# Patient Record
Sex: Male | Born: 1995
Health system: Southern US, Community
[De-identification: ages and names within clinical notes are randomized; demographics above are authoritative.]

## PROBLEM LIST (undated history)

## (undated) DIAGNOSIS — Z803 Family history of malignant neoplasm of breast: Secondary | ICD-10-CM

## (undated) DIAGNOSIS — Z808 Family history of malignant neoplasm of other organs or systems: Secondary | ICD-10-CM

## (undated) DIAGNOSIS — J45909 Unspecified asthma, uncomplicated: Secondary | ICD-10-CM

## (undated) DIAGNOSIS — Z8049 Family history of malignant neoplasm of other genital organs: Secondary | ICD-10-CM

## (undated) DIAGNOSIS — J189 Pneumonia, unspecified organism: Secondary | ICD-10-CM

## (undated) DIAGNOSIS — K219 Gastro-esophageal reflux disease without esophagitis: Secondary | ICD-10-CM

## (undated) DIAGNOSIS — Z800 Family history of malignant neoplasm of digestive organs: Secondary | ICD-10-CM

## (undated) HISTORY — DX: Unspecified asthma, uncomplicated: J45.909

## (undated) HISTORY — PX: UPPER GI ENDOSCOPY: SHX6162

## (undated) HISTORY — PX: NO PAST SURGERIES: SHX2092

## (undated) HISTORY — DX: Family history of malignant neoplasm of other organs or systems: Z80.8

## (undated) HISTORY — DX: Family history of malignant neoplasm of breast: Z80.3

## (undated) HISTORY — DX: Family history of malignant neoplasm of other genital organs: Z80.49

## (undated) HISTORY — DX: Family history of malignant neoplasm of digestive organs: Z80.0

---
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
## 2016-05-02 ENCOUNTER — Telehealth: Payer: Self-pay | Admitting: Family Medicine

## 2016-05-02 NOTE — Telephone Encounter (Signed)
Work him in if we can--

## 2016-05-02 NOTE — Telephone Encounter (Signed)
He can have a 6 pm apt for 30 min's and advise him to bring in his immunization records.    KP

## 2016-05-02 NOTE — Telephone Encounter (Signed)
Caller name: Jason Martinique Relationship to patient: father Can be reached: [phone]   Reason for call: Pt father stated Dr. Etter Sjogren gave verbal approval for pt to be setup with her. Pt is going back to school starting Aug 11 and wanting to get him in for cpe/new pt prior to that. He said Dr. Etter Sjogren indicated he might be able to be worked in. I scheduled him for 07/11/16 and have on wait list but is there a spot to put him in sooner?

## 2016-05-02 NOTE — Telephone Encounter (Signed)
Please advise      KP 

## 2016-05-07 NOTE — Telephone Encounter (Signed)
Immunization records received via fax from [place] and abstracted into pt's chart. Forwarded to Dr. Carollee Herter. JG//CMA

## 2016-05-19 ENCOUNTER — Telehealth: Payer: Self-pay | Admitting: *Deleted

## 2016-05-19 NOTE — Telephone Encounter (Signed)
Patient returning your call.

## 2016-05-19 NOTE — Telephone Encounter (Signed)
Unable to reach patient at time of Pre-Visit Call.  Left message for patient to return call when available.    

## 2016-05-20 ENCOUNTER — Encounter: Payer: Self-pay | Admitting: Family Medicine

## 2016-05-20 ENCOUNTER — Ambulatory Visit (INDEPENDENT_AMBULATORY_CARE_PROVIDER_SITE_OTHER): Payer: BLUE CROSS/BLUE SHIELD | Admitting: Family Medicine

## 2016-05-20 VITALS — BP 122/74 | HR 95 | Temp 98.5°F | Ht 68.0 in | Wt 166.4 lb

## 2016-05-20 DIAGNOSIS — Z0000 Encounter for general adult medical examination without abnormal findings: Secondary | ICD-10-CM

## 2016-05-20 DIAGNOSIS — D229 Melanocytic nevi, unspecified: Secondary | ICD-10-CM | POA: Diagnosis not present

## 2016-05-20 DIAGNOSIS — Z23 Encounter for immunization: Secondary | ICD-10-CM | POA: Diagnosis not present

## 2016-05-20 HISTORY — DX: Encounter for general adult medical examination without abnormal findings: Z00.00

## 2016-05-20 NOTE — Addendum Note (Signed)
Addended by: Ewing Schlein on: 05/20/2016 06:57 PM   Modules accepted: Orders

## 2016-05-20 NOTE — Patient Instructions (Signed)

## 2016-05-20 NOTE — Assessment & Plan Note (Signed)
See AVS Check labs Men b given today--- #2 in 1 month ghm utd rto 1 year for cpe

## 2016-05-20 NOTE — Progress Notes (Signed)
Patient ID: Dennis Kim, male    DOB: 07/13/1996  Age: 20 y.o. MRN: [id number]    Subjective:  Subjective  HPI Dennis Kim presents to establish.  No complaints.    Review of Systems  Constitutional: Negative for fever.  HENT: Negative for congestion.   Respiratory: Negative for shortness of breath.   Cardiovascular: Negative for chest pain, palpitations and leg swelling.  Gastrointestinal: Negative for abdominal pain, blood in stool and nausea.  Genitourinary: Negative for dysuria and frequency.  Skin: Negative for rash.  Allergic/Immunologic: Negative for environmental allergies.  Neurological: Negative for dizziness and headaches.  Psychiatric/Behavioral: The patient is not nervous/anxious.     History Past Medical History:  Diagnosis Date  . Asthma     He has no past surgical history on file.   His family history includes Brain cancer in his maternal grandmother; Thyroid disease in his father.He reports that he has never smoked. He has never used smokeless tobacco. He reports that he drinks alcohol. He reports that he does not use drugs.  No current outpatient prescriptions on file prior to visit.   No current facility-administered medications on file prior to visit.      Objective:  Objective  Physical Exam  Constitutional: He is oriented to person, place, and time. He appears well-developed and well-nourished. No distress.  HENT:  Head: Normocephalic and atraumatic.  Right Ear: External ear normal.  Left Ear: External ear normal.  Nose: Nose normal.  Mouth/Throat: Oropharynx is clear and moist. No oropharyngeal exudate.  Eyes: Conjunctivae and EOM are normal. Pupils are equal, round, and reactive to light. Right eye exhibits no discharge. Left eye exhibits no discharge.  Neck: Normal range of motion. Neck supple. No JVD present. No thyromegaly present.  Cardiovascular: Normal rate, regular rhythm and intact distal pulses.  Exam reveals no gallop and no  friction rub.   No murmur heard. Pulmonary/Chest: Effort normal and breath sounds normal. No respiratory distress. He has no wheezes. He has no rales. He exhibits no tenderness.  Abdominal: Soft. Bowel sounds are normal. He exhibits no distension and no mass. There is no tenderness. There is no rebound and no guarding.  Musculoskeletal: Normal range of motion. He exhibits no edema or tenderness.  Lymphadenopathy:    He has no cervical adenopathy.  Neurological: He is alert and oriented to person, place, and time. He displays normal reflexes. He exhibits normal muscle tone.  Skin: Skin is warm and dry. No rash noted. He is not diaphoretic. No erythema. No pallor.     Psychiatric: He has a normal mood and affect. His behavior is normal. Judgment and thought content normal.  Nursing note and vitals reviewed.  BP 122/74 (BP Location: Left Arm, Patient Position: Sitting, Cuff Size: Normal)   Pulse 95   Temp 98.5 F (36.9 C) (Oral)   Ht 5\' 8"  (1.727 m)   Wt 166 lb 6.4 oz (75.5 kg)   SpO2 98%   BMI 25.30 kg/m  Wt Readings from Last 3 Encounters:  05/20/16 166 lb 6.4 oz (75.5 kg) (69 %, Z= 0.48)*   * Growth percentiles are based on CDC 2-20 Years data.     No results found for: WBC, HGB, HCT, PLT, GLUCOSE, CHOL, TRIG, HDL, LDLDIRECT, LDLCALC, ALT, AST, NA, K, CL, CREATININE, BUN, CO2, TSH, PSA, INR, GLUF, HGBA1C, MICROALBUR  Dg Chest 2 View  Result Date: 07/06/2008 Clinical Data: Cough.  Follow up pneumonia.  CHEST - 2 VIEW  Comparison: [facility] chest  x-ray 05/28/2008.  Findings: Interval clearing previous left lower lobe pneumonia and small left pleural effusion noted.  Slight stable asthma or bronchiolitis findings noted with lungs otherwise clear.  Upper airway, mediastinum, heart, pleura, and osseous structures appear normal.  IMPRESSION:  1.  Interval clearing previous left lower lobe pneumonia and small left pleural effusion. 2.  Otherwise stable changes of asthma or slight perihilar  bronchiolitis. 3.  Otherwise, negative. Provider: Pearla Dubonnet    Assessment & Plan:  Plan  [name] does not currently have medications on file.  No orders of the defined types were placed in this encounter.   Problem List Items Addressed This Visit      Unprioritized   Preventative health care - Primary    See AVS Check labs Men b given today--- #2 in 1 month ghm utd rto 1 year for cpe      Relevant Orders   Comprehensive metabolic panel   CBC with Differential/Platelet   Lipid panel   POCT urinalysis dipstick   TSH    Other Visit Diagnoses    Suspicious nevus       Relevant Orders   Ambulatory referral to Dermatology      Follow-up: No Follow-up on file.  Ann Held, DO

## 2016-05-21 ENCOUNTER — Other Ambulatory Visit (INDEPENDENT_AMBULATORY_CARE_PROVIDER_SITE_OTHER): Payer: BLUE CROSS/BLUE SHIELD

## 2016-05-21 ENCOUNTER — Encounter: Payer: Self-pay | Admitting: Family Medicine

## 2016-05-21 DIAGNOSIS — Z0000 Encounter for general adult medical examination without abnormal findings: Secondary | ICD-10-CM | POA: Diagnosis not present

## 2016-05-21 LAB — POCT URINALYSIS DIPSTICK
Bilirubin, UA: NEGATIVE
Blood, UA: NEGATIVE
Glucose, UA: NEGATIVE
Ketones, UA: NEGATIVE
Leukocytes, UA: NEGATIVE
Nitrite, UA: NEGATIVE
Protein, UA: NEGATIVE
Spec Grav, UA: >=1.03
Urobilinogen, UA: 0.2
pH, UA: 6

## 2016-05-21 LAB — COMPREHENSIVE METABOLIC PANEL
ALK PHOS: 96 U/L (ref 52–171)
ALT: 20 U/L (ref 0–53)
AST: 17 U/L (ref 0–37)
Albumin: 4.8 g/dL (ref 3.5–5.2)
BILIRUBIN TOTAL: 0.6 mg/dL (ref 0.2–1.2)
BUN: 9 mg/dL (ref 6–23)
CO2: 29 meq/L (ref 19–32)
CREATININE: 0.8 mg/dL (ref 0.40–1.50)
Calcium: 10 mg/dL (ref 8.4–10.5)
Chloride: 102 mEq/L (ref 96–112)
GFR: 131.92 mL/min (ref >60.00)
GLUCOSE: 98 mg/dL (ref 70–99)
Potassium: 3.8 mEq/L (ref 3.5–5.1)
SODIUM: 140 meq/L (ref 135–145)
TOTAL PROTEIN: 7.8 g/dL (ref 6.0–8.3)

## 2016-05-21 LAB — LIPID PANEL
Cholesterol: 181 mg/dL (ref 0–200)
HDL: 45.1 mg/dL
LDL Cholesterol: 107 mg/dL — ABNORMAL HIGH (ref 0–99)
NonHDL: 136.18
Total CHOL/HDL Ratio: 4
Triglycerides: 144 mg/dL (ref 0.0–149.0)
VLDL: 28.8 mg/dL (ref 0.0–40.0)

## 2016-05-21 LAB — CBC WITH DIFFERENTIAL/PLATELET
Basophils Absolute: 0 K/uL (ref 0.0–0.1)
Basophils Relative: 0.3 % (ref 0.0–3.0)
Eosinophils Absolute: 0.1 K/uL (ref 0.0–0.7)
Eosinophils Relative: 0.7 % (ref 0.0–5.0)
HCT: 45.2 % (ref 36.0–49.0)
Hemoglobin: 15.6 g/dL (ref 12.0–16.0)
Lymphocytes Relative: 17.6 % — ABNORMAL LOW (ref 24.0–48.0)
Lymphs Abs: 2.3 K/uL (ref 0.7–4.0)
MCHC: 34.5 g/dL (ref 31.0–37.0)
MCV: 88.3 fl (ref 78.0–98.0)
Monocytes Absolute: 1.4 K/uL — ABNORMAL HIGH (ref 0.1–1.0)
Monocytes Relative: 11.2 % (ref 3.0–12.0)
Neutro Abs: 9.1 K/uL — ABNORMAL HIGH (ref 1.4–7.7)
Neutrophils Relative %: 70.2 % (ref 43.0–71.0)
Platelets: 214 K/uL (ref 150.0–575.0)
RBC: 5.12 Mil/uL (ref 3.80–5.70)
RDW: 13.9 % (ref 11.4–15.5)
WBC: 13 K/uL (ref 4.5–13.5)

## 2016-05-21 LAB — TSH: TSH: 2.06 u[IU]/mL (ref 0.40–5.00)

## 2016-06-20 DIAGNOSIS — D485 Neoplasm of uncertain behavior of skin: Secondary | ICD-10-CM | POA: Diagnosis not present

## 2016-06-20 DIAGNOSIS — D2272 Melanocytic nevi of left lower limb, including hip: Secondary | ICD-10-CM | POA: Diagnosis not present

## 2016-06-20 DIAGNOSIS — D2262 Melanocytic nevi of left upper limb, including shoulder: Secondary | ICD-10-CM | POA: Diagnosis not present

## 2016-07-11 ENCOUNTER — Ambulatory Visit: Payer: Self-pay | Admitting: Family Medicine

## 2016-08-04 ENCOUNTER — Encounter: Payer: Self-pay | Admitting: Family Medicine

## 2016-08-04 DIAGNOSIS — D2272 Melanocytic nevi of left lower limb, including hip: Secondary | ICD-10-CM | POA: Diagnosis not present

## 2016-08-04 DIAGNOSIS — D485 Neoplasm of uncertain behavior of skin: Secondary | ICD-10-CM | POA: Diagnosis not present

## 2017-07-31 ENCOUNTER — Ambulatory Visit (INDEPENDENT_AMBULATORY_CARE_PROVIDER_SITE_OTHER): Payer: BLUE CROSS/BLUE SHIELD | Admitting: Family Medicine

## 2017-07-31 ENCOUNTER — Encounter: Payer: Self-pay | Admitting: Family Medicine

## 2017-07-31 VITALS — BP 140/80 | HR 84 | Temp 98.2°F | Resp 16 | Ht 67.0 in | Wt 154.6 lb

## 2017-07-31 DIAGNOSIS — J069 Acute upper respiratory infection, unspecified: Secondary | ICD-10-CM

## 2017-07-31 DIAGNOSIS — R05 Cough: Secondary | ICD-10-CM

## 2017-07-31 DIAGNOSIS — J029 Acute pharyngitis, unspecified: Secondary | ICD-10-CM

## 2017-07-31 DIAGNOSIS — J40 Bronchitis, not specified as acute or chronic: Secondary | ICD-10-CM | POA: Diagnosis not present

## 2017-07-31 DIAGNOSIS — R059 Cough, unspecified: Secondary | ICD-10-CM

## 2017-07-31 LAB — POCT RAPID STREP A (OFFICE): RAPID STREP A SCREEN: NEGATIVE

## 2017-07-31 MED ORDER — LEVOCETIRIZINE DIHYDROCHLORIDE 5 MG PO TABS: 5.0000 mg | ORAL_TABLET | Freq: Every evening | ORAL | 5 refills | Status: DC

## 2017-07-31 MED ORDER — FLUTICASONE PROPIONATE 50 MCG/ACT NA SUSP: 2.0000 | Freq: Every day | NASAL | 6 refills | Status: DC

## 2017-07-31 MED ORDER — AZITHROMYCIN 250 MG PO TABS: ORAL_TABLET | ORAL | 0 refills | Status: DC

## 2017-07-31 NOTE — Patient Instructions (Signed)

## 2017-07-31 NOTE — Progress Notes (Signed)
Patient ID: Dennis Kim, male    DOB: 1996/03/09  Age: 21 y.o. MRN: [id number]    Subjective:  Subjective  HPI Dennis Kim presents for cough, sore throat, congestion, and chest tightness x 3 weeks   + fever-- 100  Pt has been taking ibuprofen.  + stuffy nose.     Review of Systems  Constitutional: Positive for chills and fever.  HENT: Positive for congestion, postnasal drip and rhinorrhea. Negative for sinus pressure.   Respiratory: Positive for cough and chest tightness. Negative for shortness of breath and wheezing.   Cardiovascular: Negative for chest pain, palpitations and leg swelling.  Allergic/Immunologic: Negative for environmental allergies.    History Past Medical History:  Diagnosis Date  . Asthma     He has no past surgical history on file.   His family history includes Brain cancer in his maternal grandmother; Thyroid disease in his father.He reports that he has never smoked. He has never used smokeless tobacco. He reports that he drinks alcohol. He reports that he does not use drugs.  No current outpatient prescriptions on file prior to visit.   No current facility-administered medications on file prior to visit.      Objective:  Objective  Physical Exam  Constitutional: He is oriented to person, place, and time. He appears well-developed and well-nourished.  HENT:  Right Ear: External ear normal.  Left Ear: External ear normal.  Nose: Right sinus exhibits no maxillary sinus tenderness and no frontal sinus tenderness. Left sinus exhibits no maxillary sinus tenderness and no frontal sinus tenderness.  Mouth/Throat: Posterior oropharyngeal erythema present.  + PND + errythema  Eyes: Conjunctivae are normal. Right eye exhibits no discharge. Left eye exhibits no discharge.  Cardiovascular: Normal rate, regular rhythm and normal heart sounds.   No murmur heard. Pulmonary/Chest: Effort normal and breath sounds normal. No respiratory distress. He has no  wheezes. He has no rales. He exhibits no tenderness.  Musculoskeletal: He exhibits no edema.  Lymphadenopathy:    He has no cervical adenopathy.  Neurological: He is alert and oriented to person, place, and time.  Nursing note and vitals reviewed.  BP 140/80   Pulse 84   Temp 98.2 F (36.8 C) (Oral)   Resp 16   Ht 5\' 7"  (1.702 m)   Wt 154 lb 9.6 oz (70.1 kg)   SpO2 99%   BMI 24.21 kg/m  Wt Readings from Last 3 Encounters:  07/31/17 154 lb 9.6 oz (70.1 kg)  05/20/16 166 lb 6.4 oz (75.5 kg) (69 %, Z= 0.48)*   * Growth percentiles are based on CDC 2-20 Years data.     Lab Results  Component Value Date   WBC 13.0 05/21/2016   HGB 15.6 05/21/2016   HCT 45.2 05/21/2016   PLT 214.0 05/21/2016   GLUCOSE 98 05/21/2016   CHOL 181 05/21/2016   TRIG 144.0 05/21/2016   HDL 45.10 05/21/2016   LDLCALC 107 (H) 05/21/2016   ALT 20 05/21/2016   AST 17 05/21/2016   NA 140 05/21/2016   K 3.8 05/21/2016   CL 102 05/21/2016   CREATININE 0.80 05/21/2016   BUN 9 05/21/2016   CO2 29 05/21/2016   TSH 2.06 05/21/2016    Dg Chest 2 View  Result Date: 07/06/2008 Clinical Data: Cough.  Follow up pneumonia.  CHEST - 2 VIEW  Comparison: [facility] chest x-ray 05/28/2008.  Findings: Interval clearing previous left lower lobe pneumonia and small left pleural effusion noted.  Slight stable asthma  or bronchiolitis findings noted with lungs otherwise clear.  Upper airway, mediastinum, heart, pleura, and osseous structures appear normal.  IMPRESSION:  1.  Interval clearing previous left lower lobe pneumonia and small left pleural effusion. 2.  Otherwise stable changes of asthma or slight perihilar bronchiolitis. 3.  Otherwise, negative. Provider: Pearla Dubonnet    Assessment & Plan:  Plan  I am having [name] start on fluticasone, levocetirizine, and azithromycin.  Meds ordered this encounter  Medications  . fluticasone (FLONASE) 50 MCG/ACT nasal spray    Sig: Place 2 sprays into both nostrils daily.     Dispense:  16 g    Refill:  6  . levocetirizine (XYZAL) 5 MG tablet    Sig: Take 1 tablet (5 mg total) by mouth every evening.    Dispense:  30 tablet    Refill:  5  . azithromycin (ZITHROMAX Z-PAK) 250 MG tablet    Sig: As directed    Dispense:  6 each    Refill:  0    Problem List Items Addressed This Visit    None    Visit Diagnoses    Sore throat    -  Primary   Relevant Orders   POCT rapid strep A (Completed)   Cough       Relevant Orders   POCT Influenza A/B   Viral upper respiratory tract infection       Relevant Medications   fluticasone (FLONASE) 50 MCG/ACT nasal spray   levocetirizine (XYZAL) 5 MG tablet   azithromycin (ZITHROMAX Z-PAK) 250 MG tablet   Bronchitis       Relevant Medications   azithromycin (ZITHROMAX Z-PAK) 250 MG tablet      Follow-up: Return if symptoms worsen or fail to improve.  Ann Held, DO

## 2017-08-24 LAB — POCT INFLUENZA A/B
INFLUENZA A, POC: NEGATIVE
INFLUENZA B, POC: NEGATIVE

## 2018-05-31 DIAGNOSIS — R0982 Postnasal drip: Secondary | ICD-10-CM | POA: Diagnosis not present

## 2018-05-31 DIAGNOSIS — J029 Acute pharyngitis, unspecified: Secondary | ICD-10-CM | POA: Diagnosis not present

## 2018-06-30 ENCOUNTER — Encounter: Payer: Self-pay | Admitting: Medical

## 2018-06-30 ENCOUNTER — Ambulatory Visit (HOSPITAL_BASED_OUTPATIENT_CLINIC_OR_DEPARTMENT_OTHER)
Admission: RE | Admit: 2018-06-30 | Discharge: 2018-06-30 | Disposition: A | Discharge status: Home / Self Care | Payer: BLUE CROSS/BLUE SHIELD | Source: Ambulatory Visit | Attending: Medical | Admitting: Medical

## 2018-06-30 ENCOUNTER — Telehealth: Payer: Self-pay | Admitting: Medical

## 2018-06-30 ENCOUNTER — Encounter: Payer: Self-pay | Admitting: *Deleted

## 2018-06-30 ENCOUNTER — Ambulatory Visit: Payer: BLUE CROSS/BLUE SHIELD | Admitting: Medical

## 2018-06-30 VITALS — BP 124/72 | HR 79 | Temp 98.0°F | Resp 16 | Ht 67.0 in | Wt 146.2 lb

## 2018-06-30 DIAGNOSIS — R9431 Abnormal electrocardiogram [ECG] [EKG]: Secondary | ICD-10-CM | POA: Diagnosis not present

## 2018-06-30 DIAGNOSIS — I319 Disease of pericardium, unspecified: Secondary | ICD-10-CM

## 2018-06-30 DIAGNOSIS — R0789 Other chest pain: Secondary | ICD-10-CM

## 2018-06-30 DIAGNOSIS — R079 Chest pain, unspecified: Secondary | ICD-10-CM | POA: Diagnosis not present

## 2018-06-30 DIAGNOSIS — Z8709 Personal history of other diseases of the respiratory system: Secondary | ICD-10-CM | POA: Diagnosis not present

## 2018-06-30 DIAGNOSIS — R0602 Shortness of breath: Secondary | ICD-10-CM | POA: Diagnosis not present

## 2018-06-30 LAB — SEDIMENTATION RATE: SED RATE: 5 mm/h (ref 0–15)

## 2018-06-30 LAB — POCT RAPID STREP A (OFFICE): Rapid Strep A Screen: NEGATIVE

## 2018-06-30 LAB — TROPONIN I: TNIDX: 0 ug/L (ref 0.00–0.06)

## 2018-06-30 MED ORDER — ALBUTEROL SULFATE HFA 108 (90 BASE) MCG/ACT IN AERS: 2.0000 | INHALATION_SPRAY | Freq: Four times a day (QID) | RESPIRATORY_TRACT | 2 refills | Status: DC | PRN

## 2018-06-30 NOTE — Telephone Encounter (Signed)
Copied from [place] [id number]. Topic: Quick Communication - Appointment Cancellation >> Jun 30, 2018  9:36 AM Rutherford Nail, NT wrote: Patient called to cancel appointment scheduled for 07/02/18. Patient has rescheduled their appointment.  Route to department's PEC pool.

## 2018-06-30 NOTE — Patient Instructions (Addendum)
For your history of atypical chest pain following remote possible strep infection( with concern possible pericarditis), I want you to take ibuprofen otc  600 to 800 mg 3 times daily.  You had some slight EKG abnormalities reviewed by cardiologist(Dr Revinkar) and this could be associated with a possible pericarditis(I thought borderline st elevation in anterior leads on review and discussion with cardiologist).  The cardiologist recommended possible echocardiogram and a sed rate.  In addition I want to go ahead and get your troponin/cardiac protein study and chest x-ray.  If you have any wheezing then would recommend using albuterol.  But your symptoms do not appear asthma-like.  Your rapid strep test was negative today but you also had been on the a azithromycin antibiotic.  If you have any worsening signs symptoms after hours despite the above measures then recommend ED evaluation.  Please make sure that you answer your phone as I do want to get the echo and that would need to be scheduled.  Follow-up in 7 days or as needed.

## 2018-06-30 NOTE — Telephone Encounter (Signed)
Will you see order for echo. Do you need to get prior authorization? See order I thinks [facility]. Will you call them and have them get him scheduled.

## 2018-06-30 NOTE — Progress Notes (Signed)
Subjective:    Patient ID: Dennis Kim, male    DOB: 1995-12-25, 22 y.o.   MRN: [id number]  HPI  Pt in reporting that on Sunday when he was sitting in his apartment he felt like he could not take full deep breath. When he takes full deep breath he points to area above xyphoid process. Pt states he did work out on Saturday and he state walked with 3-4 sprints on treadmill for 30 seconds-1 mile. He thinks treadmill during jobs/sprints set 6-7 mph. No chest discomfort or sob at that time.   Pt states remotely 3-4 weeks ago had st. They did give him antibiotic but no strep test was negative. Pain in his chest is constant since Sunday.   Pt has history of asthma. He states in middle school when 22 yo was his last  Asthma attack. When he walks he feels sob when walks past 3-4 days.  Pt states tried sister neb machine with albuterol yesterday evening. He states did not help much. Only states made his heart beat fast.  Pt state physical faint tight sensation in lower chest seem to worsen little bit.  No leg pain. No leg swelling.  Pt states urgent care gave him azithromycin 3-4 weeks ago for moderate/nagging  Throat pain for days or so. He can't remember details of that pain. But now no longer complaining of st.  At rest he has 3-4 low level discomfort. With activity area of discomfort 7-8/10.   No report of any drug use.     Review of Systems  Constitutional: Negative for chills, fatigue and fever.  HENT: Positive for sore throat. Negative for congestion, mouth sores, postnasal drip and sneezing.        St 3-4 weeks ago.  Respiratory: Negative for choking, chest tightness, shortness of breath and wheezing.   Cardiovascular: Positive for chest pain. Negative for palpitations.       Atypical chest discomfort.  Gastrointestinal: Negative for abdominal pain.  Hematological: Negative for adenopathy. Does not bruise/bleed easily.  Psychiatric/Behavioral: Negative for behavioral problems,  hallucinations and suicidal ideas. The patient is not nervous/anxious.    Past Medical History:  Diagnosis Date  . Asthma      Social History   Socioeconomic History  . Marital status: Single    Spouse name: Not on file  . Number of children: Not on file  . Years of education: Not on file  . Highest education level: Not on file  Occupational History    Comment: student [employer]  Social Needs  . Financial resource strain: Not on file  . Food insecurity:    Worry: Not on file    Inability: Not on file  . Transportation needs:    Medical: Not on file    Non-medical: Not on file  Tobacco Use  . Smoking status: Never Smoker  . Smokeless tobacco: Never Used  Substance and Sexual Activity  . Alcohol use: Yes    Comment: rare  . Drug use: No  . Sexual activity: Never    Partners: Female  Lifestyle  . Physical activity:    Days per week: Not on file    Minutes per session: Not on file  . Stress: Not on file  Relationships  . Social connections:    Talks on phone: Not on file    Gets together: Not on file    Attends religious service: Not on file    Active member of club or organization: Not on  file    Attends meetings of clubs or organizations: Not on file    Relationship status: Not on file  . Intimate partner violence:    Fear of current or ex partner: Not on file    Emotionally abused: Not on file    Physically abused: Not on file    Forced sexual activity: Not on file  Other Topics Concern  . Not on file  Social History Narrative  . Not on file    No past surgical history on file.  Family History  Problem Relation Age of Onset  . Brain cancer Maternal Grandmother   . Thyroid disease Father     No Known Allergies  No current outpatient medications on file prior to visit.   No current facility-administered medications on file prior to visit.     BP 124/72   Pulse 79   Temp 98 F (36.7 C) (Oral)   Resp 16   Ht 5\' 7"  (1.702 m)   Wt 146 lb 3.2 oz  (66.3 kg)   SpO2 100%   BMI 22.90 kg/m       Objective:   Physical Exam  General  Mental Status - Alert. General Appearance - Well groomed. Not in acute distress.  Skin Rashes- No Rashes.  HEENT Head- Normal. Ear Auditory Canal - Left- Normal. Right - Normal.Tympanic Membrane- Left- Normal. Right- Normal. Eye Sclera/Conjunctiva- Left- Normal. Right- Normal. Nose & Sinuses Nasal Mucosa- Left-  Boggy and Congested. Right-  Boggy and  Congested.Bilateral no maxillary and  No frontal sinus pressure. Mouth & Throat Lips: Upper Lip- Normal: no dryness, cracking, pallor, cyanosis, or vesicular eruption. Lower Lip-Normal: no dryness, cracking, pallor, cyanosis or vesicular eruption. Buccal Mucosa- Bilateral- No Aphthous ulcers. Oropharynx- No Discharge or Erythema. Tonsils: Characteristics- Bilateral- No Erythema . Size/Enlargement- Bilateral-  Faint +  enlargement. Discharge- bilateral-None.  Neck Neck- Supple. No Masses. No enlarged lymph nodes.   Chest and Lung Exam Auscultation: Breath Sounds:-Clear even and unlabored.  Cardiovascular Auscultation:Rythm- Regular, rate and rhythm. Murmurs & Other Heart Sounds:Ausculatation of the heart reveal- No Murmurs.  Lymphatic Head & Neck General Head & Neck Lymphatics: Bilateral: Description- No Localized lymphadenopathy.       Assessment & Plan:  For your history of atypical chest pain following remote possible strep infection(with concern for possible pericarditis), I want you to take ibuprofen otc 600 to 800 mg 3 times daily.  You had some slight EKG abnormalities reviewed by cardiologist and this could be associated with a possible pericarditis.  It is not clear only by the EKG.  A cardiologist recommended possible echocardiogram and a sed rate.  In addition I want to go ahead and get your troponin/cardiac protein study and chest x-ray.  If you have any wheezing then would recommend using albuterol.  But your symptoms do not  appear asthma-like.  Your rapid strep test was negative today but you also had been on the a azithromycin antibiotic.  If you have any worsening signs symptoms after hours despite the above measures then recommend ED evaluation.  Please make sure that you answer your phone as I do want to get the echo and that would need to be scheduled.  Follow-up in 7 days or as needed.   [phone].  Mackie Pai, PA-C

## 2018-07-01 ENCOUNTER — Other Ambulatory Visit: Payer: Self-pay

## 2018-07-01 ENCOUNTER — Ambulatory Visit (HOSPITAL_COMMUNITY): Payer: BLUE CROSS/BLUE SHIELD | Attending: Cardiology

## 2018-07-01 ENCOUNTER — Telehealth: Payer: Self-pay

## 2018-07-01 DIAGNOSIS — J45909 Unspecified asthma, uncomplicated: Secondary | ICD-10-CM | POA: Diagnosis not present

## 2018-07-01 DIAGNOSIS — R079 Chest pain, unspecified: Secondary | ICD-10-CM | POA: Diagnosis not present

## 2018-07-01 DIAGNOSIS — R9431 Abnormal electrocardiogram [ECG] [EKG]: Secondary | ICD-10-CM | POA: Diagnosis not present

## 2018-07-01 DIAGNOSIS — I319 Disease of pericardium, unspecified: Secondary | ICD-10-CM | POA: Diagnosis not present

## 2018-07-01 NOTE — Telephone Encounter (Signed)
Copied from [place] [id number]. Topic: Referral - Status >> Jul 01, 2018 11:18 AM Conception Chancy, NT wrote: Reason for CRM: Charmin is calling from [facility] and states that when the patient is billed it is billed through [facility]. She fixed it on this patient, but said for future reference.   [phone]

## 2018-07-02 ENCOUNTER — Ambulatory Visit: Payer: BLUE CROSS/BLUE SHIELD | Admitting: Family Medicine

## 2018-07-04 ENCOUNTER — Encounter: Payer: Self-pay | Admitting: Medical

## 2018-07-07 ENCOUNTER — Encounter: Payer: Self-pay | Admitting: Medical

## 2018-07-07 ENCOUNTER — Ambulatory Visit: Payer: BLUE CROSS/BLUE SHIELD | Admitting: Medical

## 2018-07-07 VITALS — BP 109/71 | HR 80 | Temp 98.2°F | Resp 16 | Ht 67.0 in | Wt 146.2 lb

## 2018-07-07 DIAGNOSIS — R0781 Pleurodynia: Secondary | ICD-10-CM

## 2018-07-07 DIAGNOSIS — M940 Chondrocostal junction syndrome [Tietze]: Secondary | ICD-10-CM | POA: Diagnosis not present

## 2018-07-07 DIAGNOSIS — R0789 Other chest pain: Secondary | ICD-10-CM

## 2018-07-07 LAB — TROPONIN I: TNIDX: 0 ug/l (ref 0.00–0.06)

## 2018-07-07 MED ORDER — PREDNISONE 10 MG PO TABS: ORAL_TABLET | ORAL | 0 refills | Status: DC

## 2018-07-07 NOTE — Patient Instructions (Signed)
You have some slight improvement of prior symptoms.  Pain on deep inspiration and at the costochondral junction.  Your pain may be pleuritic versus costochondritis.  Your prior echo did not reveal any worrisome findings and previous cardiac protein study was negative.  Repeat EKG today shows  exact same findings wit no no changes seen.  Appears sinus rhythm with probable repolarization variant.  I do want to prescribe you taper prednisone course that she will take over the next 6 days.  This will definitely be a more aggressive treatment than just 2 to 3 days of ibuprofen.  If you would my chart me on Saturday give me update how you are doing on the prednisone after 3 days.  Even if you are improved I do want you to take the full 6-day course.  Presently want to repeat cardiac protein study.  Also continue not to do any strenuous exercise until symptoms begin to dissipate significantly.  Follow-up in 7 to 10 days or as needed.

## 2018-07-07 NOTE — Progress Notes (Signed)
Subjective:    Patient ID: Dennis Kim, male    DOB: Dec 16, 1995, 22 y.o.   MRN: [id number]  HPI  Pt still feels some mild anterior chest mild discomfort. Pain is most present when he deep breaths. Pain is in same location but less intense. Pt states he tried albuterol and it did not help. Pt took ibuprofen but only 2-3 days in a row after I saw him.   See last note. Pt had ekg done(some concern for pericarditis at that time.) Discussed ekg and pt presentation with Dr. Cherrie Gauze at that time. Who recommended nsaids and echo.  Pt had negative echocardiogram.   Pt had taken azithromycin now about 4 wks ago.(pt had no st last time. His strep test was negative)    Review of Systems  Constitutional: Negative for chills, fatigue and fever.  HENT: Negative for congestion and ear discharge.   Respiratory: Negative for cough, chest tightness, shortness of breath and wheezing.        Pleuritic type pain. Vs costochondritis.  Cardiovascular: Negative for chest pain and palpitations.       See hpi. Atypical pain.  Gastrointestinal: Negative for anal bleeding.  Skin: Negative for rash.  Hematological: Negative for adenopathy.  Psychiatric/Behavioral: Negative for agitation and confusion.   Past Medical History:  Diagnosis Date  . Asthma      Social History   Socioeconomic History  . Marital status: Single    Spouse name: Not on file  . Number of children: Not on file  . Years of education: Not on file  . Highest education level: Not on file  Occupational History    Comment: student [employer]  Social Needs  . Financial resource strain: Not on file  . Food insecurity:    Worry: Not on file    Inability: Not on file  . Transportation needs:    Medical: Not on file    Non-medical: Not on file  Tobacco Use  . Smoking status: Never Smoker  . Smokeless tobacco: Never Used  Substance and Sexual Activity  . Alcohol use: Yes    Comment: rare  . Drug use: No  . Sexual activity:  Never    Partners: Female  Lifestyle  . Physical activity:    Days per week: Not on file    Minutes per session: Not on file  . Stress: Not on file  Relationships  . Social connections:    Talks on phone: Not on file    Gets together: Not on file    Attends religious service: Not on file    Active member of club or organization: Not on file    Attends meetings of clubs or organizations: Not on file    Relationship status: Not on file  . Intimate partner violence:    Fear of current or ex partner: Not on file    Emotionally abused: Not on file    Physically abused: Not on file    Forced sexual activity: Not on file  Other Topics Concern  . Not on file  Social History Narrative  . Not on file    No past surgical history on file.  Family History  Problem Relation Age of Onset  . Brain cancer Maternal Grandmother   . Thyroid disease Father     No Known Allergies  Current Outpatient Medications on File Prior to Visit  Medication Sig Dispense Refill  . albuterol (PROVENTIL HFA;VENTOLIN HFA) 108 (90 Base) MCG/ACT inhaler Inhale 2 puffs  into the lungs every 6 (six) hours as needed for wheezing or shortness of breath. 1 Inhaler 2   No current facility-administered medications on file prior to visit.     BP 109/71   Pulse 80   Temp 98.2 F (36.8 C) (Oral)   Resp 16   Ht 5\' 7"  (1.702 m)   Wt 146 lb 3.2 oz (66.3 kg)   SpO2 100%   BMI 22.90 kg/m       Objective:   Physical Exam  General Mental Status- Alert. General Appearance- Not in acute distress.   Skin General: Color- Normal Color. Moisture- Normal Moisture.  Neck . No JVD.  Chest and Lung Exam Auscultation: Breath Sounds:-Normal.  Cardiovascular Auscultation:Rythm- Regular. Murmurs & Other Heart Sounds:Auscultation of the heart reveals- No Murmurs.  Abdomen Inspection:-Inspeection Normal. Palpation/Percussion:Note:No mass. Palpation and Percussion of the abdomen reveal- Non Tender, Non Distended  + BS, no rebound or guarding.   Neurologic Cranial Nerve exam:- CN III-XII intact(No nystagmus), symmetric smile. Strength:- 5/5 equal and symmetric strength both upper and lower extremities.  Anterior thorax- no tenderness to palpation over area where he reports pain. Left lower costochondral junction region. But on deep inspiration has pain.(describes pain at point of maximal inspiration).      Assessment & Plan:  You have some slight improvement of prior symptoms.  Pain on deep inspiration and at the costochondral junction.  Your pain may be pleuritic versus costochondritis.  Your prior echo did not reveal any worrisome findings and previous cardiac protein study was negative.  Repeat EKG today shows  exact same findings wit no no changes seen.  Appears sinus rhythm with probable repolarization variant.  I do want to prescribe you taper prednisone course that she will take over the next 6 days.  This will definitely be a more aggressive treatment than just 2 to 3 days of ibuprofen.  If you would my chart me on Saturday give me update how you are doing on the prednisone after 3 days.  Even if you are improved I do want you to take the full 6-day course.  Presently want to repeat cardiac protein study.  Also continue not to do any strenuous exercise until symptoms begin to dissipate significantly.  Follow-up in 7 to 10 days or as needed.  Got message from staff that will be 2 hour delay on stat troponin. Told staff ok due to low clinical suspicious but needed test today  Mackie Pai, PA-C

## 2018-07-11 ENCOUNTER — Encounter: Payer: Self-pay | Admitting: Medical

## 2018-07-12 ENCOUNTER — Telehealth: Payer: Self-pay | Admitting: Medical

## 2018-07-12 DIAGNOSIS — R0789 Other chest pain: Secondary | ICD-10-CM

## 2018-07-12 MED ORDER — OMEPRAZOLE 20 MG PO CPDR: 20.0000 mg | DELAYED_RELEASE_CAPSULE | Freq: Every day | ORAL | 3 refills | Status: DC

## 2018-07-12 NOTE — Telephone Encounter (Signed)
  Will you get patient scheduled with cardiologist this week if possible. Please sea referral.

## 2018-07-12 NOTE — Telephone Encounter (Signed)
I sent in rx of omeprazole to pt pharmacy. After he sent my chart message regarding chest and abdomen discomfort. Referred cardiologist as well.

## 2018-07-13 ENCOUNTER — Encounter: Payer: Self-pay | Admitting: Medical

## 2018-07-15 ENCOUNTER — Encounter: Payer: Self-pay | Admitting: Family Medicine

## 2018-07-15 ENCOUNTER — Ambulatory Visit: Payer: BLUE CROSS/BLUE SHIELD | Admitting: Family Medicine

## 2018-07-15 VITALS — BP 116/74 | HR 93 | Temp 98.3°F | Resp 14 | Ht 67.0 in | Wt 149.1 lb

## 2018-07-15 DIAGNOSIS — Z9109 Other allergy status, other than to drugs and biological substances: Secondary | ICD-10-CM

## 2018-07-15 DIAGNOSIS — J4520 Mild intermittent asthma, uncomplicated: Secondary | ICD-10-CM | POA: Diagnosis not present

## 2018-07-15 DIAGNOSIS — R002 Palpitations: Secondary | ICD-10-CM | POA: Diagnosis not present

## 2018-07-15 DIAGNOSIS — R079 Chest pain, unspecified: Secondary | ICD-10-CM

## 2018-07-15 DIAGNOSIS — I422 Other hypertrophic cardiomyopathy: Secondary | ICD-10-CM | POA: Diagnosis not present

## 2018-07-15 MED ORDER — LEVOCETIRIZINE DIHYDROCHLORIDE 5 MG PO TABS: 5.0000 mg | ORAL_TABLET | Freq: Every evening | ORAL | 5 refills | Status: DC

## 2018-07-15 NOTE — Patient Instructions (Signed)
Chest Wall Pain °Chest wall pain is pain in or around the bones and muscles of your chest. Sometimes, an injury causes this pain. Sometimes, the cause may not be known. This pain may take several weeks or longer to get better. °Follow these instructions at home: °Pay attention to any changes in your symptoms. Take these actions to help with your pain: °· Rest as told by your health care provider. °· Avoid activities that cause pain. These include any activities that use your chest muscles or your abdominal and side muscles to lift heavy items. °· If directed, apply ice to the painful area: °? Put ice in a plastic bag. °? Place a towel between your skin and the bag. °? Leave the ice on for 20 minutes, 2-3 times per day. °· Take over-the-counter and prescription medicines only as told by your health care provider. °· Do not use tobacco products, including cigarettes, chewing tobacco, and e-cigarettes. If you need help quitting, ask your health care provider. °· Keep all follow-up visits as told by your health care provider. This is important. ° °Contact a health care provider if: °· You have a fever. °· Your chest pain becomes worse. °· You have new symptoms. °Get help right away if: °· You have nausea or vomiting. °· You feel sweaty or light-headed. °· You have a cough with phlegm (sputum) or you cough up blood. °· You develop shortness of breath. °This information is not intended to replace advice given to you by your health care provider. Make sure you discuss any questions you have with your health care provider. °Document Released: 10/06/2005 Document Revised: 02/14/2016 Document Reviewed: 01/01/2015 °Elsevier Interactive Patient Education © 2018 Elsevier Inc. ° °

## 2018-07-15 NOTE — Progress Notes (Signed)
Pre visit review using our clinic review tool, if applicable. No additional management support is needed unless otherwise documented below in the visit note. 

## 2018-07-15 NOTE — Progress Notes (Signed)
Patient ID: Dennis Kim, male    DOB: [birth year]  Age: 22 y.o. MRN: [id number]    Subjective:  Subjective  HPI Dennis Kim presents for sob , chest tightness chest pain.  He has seen Percell Miller and had ekg and labs----  No chest pain today.  He has an appointment to see cardiology tomorrow   He c/o feeling like there is a blockage when he eats  He also hx asthma and has an albuterol inhaler to use prn.  He is also taking omeprazole  He does feel like his allergies are worse and is interested in seeing an allergist for this  Review of Systems  Constitutional: Negative for fever.  HENT: Positive for postnasal drip and trouble swallowing. Negative for congestion, sinus pressure, sneezing and sore throat.   Respiratory: Positive for chest tightness and shortness of breath.   Cardiovascular: Negative for chest pain, palpitations and leg swelling.  Gastrointestinal: Negative for abdominal pain, blood in stool and nausea.  Genitourinary: Negative for dysuria and frequency.  Skin: Negative for rash.  Allergic/Immunologic: Negative for environmental allergies.  Neurological: Negative for dizziness and headaches.  Psychiatric/Behavioral: The patient is not nervous/anxious.     History Past Medical History:  Diagnosis Date  . Asthma     He has a past surgical history that includes No past surgeries.   His family history includes Brain cancer in his maternal grandmother; Thyroid disease in his father.He reports that he has never smoked. He has never used smokeless tobacco. He reports that he drinks alcohol. He reports that he does not use drugs.  Current Outpatient Medications on File Prior to Visit  Medication Sig Dispense Refill  . omeprazole (PRILOSEC) 20 MG capsule Take 1 capsule (20 mg total) by mouth daily. 30 capsule 3  . albuterol (PROVENTIL HFA;VENTOLIN HFA) 108 (90 Base) MCG/ACT inhaler Inhale 2 puffs into the lungs every 6 (six) hours as needed for wheezing or shortness of breath.  1 Inhaler 2   No current facility-administered medications on file prior to visit.      Objective:  Objective  Physical Exam  Constitutional: He is oriented to person, place, and time. Vital signs are normal. He appears well-developed and well-nourished. He is sleeping.  HENT:  Head: Normocephalic and atraumatic.  Right Ear: External ear normal.  Left Ear: External ear normal.  Mouth/Throat: Oropharynx is clear and moist.  + PND + errythema  Eyes: Pupils are equal, round, and reactive to light. Conjunctivae and EOM are normal. Right eye exhibits no discharge. Left eye exhibits no discharge.  Neck: Normal range of motion. Neck supple. No thyromegaly present.  Cardiovascular: Normal rate, regular rhythm and normal heart sounds.  No murmur heard. Pulmonary/Chest: Effort normal and breath sounds normal. No respiratory distress. He has no wheezes. He has no rales. He exhibits no tenderness.  Musculoskeletal: He exhibits no edema or tenderness.  Lymphadenopathy:    He has no cervical adenopathy.  Neurological: He is alert and oriented to person, place, and time.  Skin: Skin is warm and dry.  Psychiatric: He has a normal mood and affect. His behavior is normal. Judgment and thought content normal.  Nursing note and vitals reviewed.  BP 116/74 (BP Location: Left Arm, Patient Position: Sitting, Cuff Size: Small)   Pulse 93   Temp 98.3 F (36.8 C) (Oral)   Resp 14   Ht 5\' 7"  (1.702 m)   Wt 149 lb 2 oz (67.6 kg)   SpO2 98%   BMI  23.36 kg/m  Wt Readings from Last 3 Encounters:  07/16/18 148 lb (67.1 kg)  07/15/18 149 lb 2 oz (67.6 kg)  07/07/18 146 lb 3.2 oz (66.3 kg)     Lab Results  Component Value Date   WBC 13.0 05/21/2016   HGB 15.6 05/21/2016   HCT 45.2 05/21/2016   PLT 214.0 05/21/2016   GLUCOSE 98 05/21/2016   CHOL 181 05/21/2016   TRIG 144.0 05/21/2016   HDL 45.10 05/21/2016   LDLCALC 107 (H) 05/21/2016   ALT 20 05/21/2016   AST 17 05/21/2016   NA 140  05/21/2016   K 3.8 05/21/2016   CL 102 05/21/2016   CREATININE 0.80 05/21/2016   BUN 9 05/21/2016   CO2 29 05/21/2016   TSH 2.06 05/21/2016    Dg Chest 2 View  Result Date: 06/30/2018 CLINICAL DATA:  Chest tightness and pain with shortness of breath EXAM: CHEST - 2 VIEW COMPARISON:  07/06/2008 FINDINGS: The heart size and mediastinal contours are within normal limits. Both lungs are clear. The visualized skeletal structures are unremarkable. IMPRESSION: No active cardiopulmonary disease. Electronically Signed   By: Donavan Foil M.D.   On: 06/30/2018 15:09     Assessment & Plan:  Plan  I have discontinued Taden T. Morine's predniSONE. I am also having him start on levocetirizine. Additionally, I am having him maintain his albuterol and omeprazole.  Meds ordered this encounter  Medications  . levocetirizine (XYZAL) 5 MG tablet    Sig: Take 1 tablet (5 mg total) by mouth every evening.    Dispense:  30 tablet    Refill:  5    Problem List Items Addressed This Visit      Unprioritized   Asthma    Stable Has albuterol prn      Asymmetric septal hypertrophy (HCC)   Chest pain    Pt has app with cardiology tomorrow D/w pt echo , etc but since he is seeing cardiology tomorrow will defer to them Pt to go to er if chest pain returns       Palpitations    Causing some chest tightness---  Pt has appointment with cardiology tomorrow If pain returns do not hesitate to go to ER        Other Visit Diagnoses    Environmental allergies    -  Primary   Relevant Medications   levocetirizine (XYZAL) 5 MG tablet   Other Relevant Orders   Ambulatory referral to Allergy      Follow-up: Return if symptoms worsen or fail to improve.  Ann Held, DO

## 2018-07-16 ENCOUNTER — Encounter: Payer: Self-pay | Admitting: Cardiology

## 2018-07-16 ENCOUNTER — Ambulatory Visit (INDEPENDENT_AMBULATORY_CARE_PROVIDER_SITE_OTHER): Payer: BLUE CROSS/BLUE SHIELD | Admitting: Cardiology

## 2018-07-16 VITALS — BP 100/62 | HR 86 | Ht 67.0 in | Wt 148.0 lb

## 2018-07-16 DIAGNOSIS — R002 Palpitations: Secondary | ICD-10-CM

## 2018-07-16 DIAGNOSIS — R079 Chest pain, unspecified: Secondary | ICD-10-CM | POA: Diagnosis not present

## 2018-07-16 DIAGNOSIS — I422 Other hypertrophic cardiomyopathy: Secondary | ICD-10-CM | POA: Insufficient documentation

## 2018-07-16 DIAGNOSIS — R0600 Dyspnea, unspecified: Secondary | ICD-10-CM

## 2018-07-16 DIAGNOSIS — R0609 Other forms of dyspnea: Secondary | ICD-10-CM

## 2018-07-16 HISTORY — DX: Palpitations: R00.2

## 2018-07-16 HISTORY — DX: Other forms of dyspnea: R06.09

## 2018-07-16 HISTORY — DX: Other hypertrophic cardiomyopathy: I42.2

## 2018-07-16 HISTORY — DX: Dyspnea, unspecified: R06.00

## 2018-07-16 NOTE — Progress Notes (Signed)
Cardiology Consultation:    Date:  07/16/2018   ID:  Dennis Kim, DOB 05-Aug-1996, MRN [id number]  PCP:  Carollee Herter, Alferd Apa, DO  Cardiologist:  Jenne Campus, MD   Referring MD: Elise Benne   Chief Complaint  Patient presents with  . Chest Pain  . Shortness of Breath  I have chest pain and shortness of breath  History of Present Illness:    Dennis Kim is a 22 y.o. male who is being seen today for the evaluation of chest pain and shortness of breath at the request of Saguier, Percell Miller, PA-C.  About 3 weeks ago he started experiencing some chest pain.  He described a sensation of the chest that is continuous however it can get worse with walking or exercising.  He used to exercise on the regular basis but stopped about 3 weeks ago because of the sensation.  This sensation is worse with pressing left side of his sternum also worse with taking deep breath.  He is also noticing increased pounding in his chest when he walks.  He does have a fit bit and also not his heart rate being high when he walks.  Quite extensive evaluation has been done so far that include sedimentation rate which was normal, also troponins eyes done twice which were normal.  EKG show early repolarization abnormalities.  Echocardiogram was done and surprisingly echocardiogram shows some asymmetrical septal hypertrophy.  There is no obstruction.  He denies having any dizziness passing out no swelling of lower extremities.  He is to be very active but again stopped being active about 3 weeks ago with concerns about his chest pain.  He was given proton pump inhibitor with not much help he was also given prednisone with not much improvement.  Past Medical History:  Diagnosis Date  . Asthma     Past Surgical History:  Procedure Laterality Date  . NO PAST SURGERIES      Current Medications: Current Meds  Medication Sig  . albuterol (PROVENTIL HFA;VENTOLIN HFA) 108 (90 Base) MCG/ACT inhaler Inhale 2  puffs into the lungs every 6 (six) hours as needed for wheezing or shortness of breath.  . levocetirizine (XYZAL) 5 MG tablet Take 1 tablet (5 mg total) by mouth every evening.  Marland Kitchen omeprazole (PRILOSEC) 20 MG capsule Take 1 capsule (20 mg total) by mouth daily.     Allergies:   Patient has no known allergies.   Social History   Socioeconomic History  . Marital status: Single    Spouse name: Not on file  . Number of children: Not on file  . Years of education: Not on file  . Highest education level: Not on file  Occupational History    Comment: student [employer]  Social Needs  . Financial resource strain: Not on file  . Food insecurity:    Worry: Not on file    Inability: Not on file  . Transportation needs:    Medical: Not on file    Non-medical: Not on file  Tobacco Use  . Smoking status: Never Smoker  . Smokeless tobacco: Never Used  Substance and Sexual Activity  . Alcohol use: Yes    Comment: rare  . Drug use: No  . Sexual activity: Never    Partners: Female  Lifestyle  . Physical activity:    Days per week: Not on file    Minutes per session: Not on file  . Stress: Not on file  Relationships  .  Social connections:    Talks on phone: Not on file    Gets together: Not on file    Attends religious service: Not on file    Active member of club or organization: Not on file    Attends meetings of clubs or organizations: Not on file    Relationship status: Not on file  Other Topics Concern  . Not on file  Social History Narrative  . Not on file     Family History: The patient's family history includes Brain cancer in his maternal grandmother; Thyroid disease in his father. ROS:   Please see the history of present illness.    All 14 point review of systems negative except as described per history of present illness.  EKGs/Labs/Other Studies Reviewed:    The following studies were reviewed today: Echocardiogram done on 07/01/2018 - Left ventricle: The cavity  size was normal. There was moderate   focal basal hypertrophy of the septum. Systolic function was   normal. The estimated ejection fraction was in the range of 60%   to 65%. Wall motion was normal; there were no regional wall   motion abnormalities. Left ventricular diastolic function   parameters were normal. - Pericardium, extracardiac: There was no pericardial effusion.  EKG:  EKG is  ordered today.  The ekg ordered today demonstrates normal sinus rhythm right axis deviation, early repolarization abnormality poor R wave progression anterior precordium raising suspicion for anterior wall myocardial infarction.  Recent Labs: No results found for requested labs within last 8760 hours.  Recent Lipid Panel    Component Value Date/Time   CHOL 181 05/21/2016 0802   TRIG 144.0 05/21/2016 0802   HDL 45.10 05/21/2016 0802   CHOLHDL 4 05/21/2016 0802   VLDL 28.8 05/21/2016 0802   LDLCALC 107 (H) 05/21/2016 0802    Physical Exam:    VS:  BP 100/62   Pulse 86   Ht 5\' 7"  (1.702 m)   Wt 148 lb (67.1 kg)   SpO2 98%   BMI 23.18 kg/m     Wt Readings from Last 3 Encounters:  07/16/18 148 lb (67.1 kg)  07/15/18 149 lb 2 oz (67.6 kg)  07/07/18 146 lb 3.2 oz (66.3 kg)     GEN:  Well nourished, well developed in no acute distress HEENT: Normal NECK: No JVD; No carotid bruits LYMPHATICS: No lymphadenopathy CARDIAC: RRR, no murmurs, no rubs, no gallops RESPIRATORY:  Clear to auscultation without rales, wheezing or rhonchi  ABDOMEN: Soft, non-tender, non-distended MUSCULOSKELETAL:  No edema; No deformity  SKIN: Warm and dry NEUROLOGIC:  Alert and oriented x 3 PSYCHIATRIC:  Normal affect   ASSESSMENT:    1. Chest pain, unspecified type   2. Dyspnea on exertion   3. Palpitations   4. Asymmetric septal hypertrophy (HCC)    PLAN:    In order of problems listed above:  1. Chest pain which looks very atypical.  Obviously with some changes on the EKG as well as septum being thick it  is concerning I will schedule him to have a stress echocardiogram.  During the stress echocardiogram will obviously watch for evidence of ischemia however will also watch for evidence of any gradient across left ventricular outflow tract.  On physical exam he did not have any heart murmur also on the echocardiogram there was no gradient.  In the future he may require heart MRI to determine better significance of asymmetrical septal hypertrophy that was seen on echocardiogram. 2. Dyspnea on exertion echocardiogram  showed preserved left ventricular ejection fraction with some septal hypertrophy.  We will do a stress test 3. Palpitations a sensation of forceful beating of his chest when he exercised.  Again we will watch behavior of the heart rate as well as echocardiogram during stress echocardiogram. 4. Asymmetrical septal hypertrophy obviously concerning.  I anticipate any future to have a need to do MRI to more specifically delineate the nature of the problem. 5. His cholesterol is minimally elevated however I would not initiate any therapy for this right now   Medication Adjustments/Labs and Tests Ordered: Current medicines are reviewed at length with the patient today.  Concerns regarding medicines are outlined above.  Orders Placed This Encounter  Procedures  . EKG 12-Lead  . ECHOCARDIOGRAM STRESS TEST   No orders of the defined types were placed in this encounter.   Signed, Park Liter, MD, [facility]. 07/16/2018 9:43 AM    [place]

## 2018-07-16 NOTE — Patient Instructions (Signed)
Medication Instructions:  Your physician recommends that you continue on your current medications as directed. Please refer to the Current Medication list given to you today.   Labwork: None  Testing/Procedures: You had an EKG today.   Your physician has requested that you have a stress echocardiogram. For further information please visit [email or web address]. Please follow instruction sheet as given.  Follow-Up: Your physician recommends that you schedule a follow-up appointment in: 1 month.   If you need a refill on your cardiac medications before your next appointment, please call your pharmacy.   Thank you for choosing [facility]! Robyne Peers, RN [phone]    Exercise Stress Electrocardiogram An exercise stress electrocardiogram is a test to check how blood flows to your heart. It is done to find areas of poor blood flow. You will need to walk on a treadmill for this test. The electrocardiogram will record your heartbeat when you are at rest and when you are exercising. What happens before the procedure?  Do not have drinks with caffeine or foods with caffeine for 24 hours before the test, or as told by your doctor. This includes coffee, tea (even decaf tea), sodas, chocolate, and cocoa.  Follow your doctor's instructions about eating and drinking before the test.  Ask your doctor what medicines you should or should not take before the test. Take your medicines with water unless told by your doctor not to.  If you use an inhaler, bring it with you to the test.  Bring a snack to eat after the test.  Do not  smoke for 4 hours before the test.  Do not put lotions, powders, creams, or oils on your chest before the test.  Wear comfortable shoes and clothing. What happens during the procedure?  You will have patches put on your chest. Small areas of your chest may need to be shaved. Wires will be connected to the patches.  Your heart rate will be watched  while you are resting and while you are exercising.  You will walk on the treadmill. The treadmill will slowly get faster to raise your heart rate.  The test will take about 1-2 hours. What happens after the procedure?  Your heart rate and blood pressure will be watched after the test.  You may return to your normal diet, activities, and medicines or as told by your doctor. This information is not intended to replace advice given to you by your health care provider. Make sure you discuss any questions you have with your health care provider. Document Released: 03/24/2008 Document Revised: 06/04/2016 Document Reviewed: 06/13/2013 Elsevier Interactive Patient Education  [employer].

## 2018-07-18 DIAGNOSIS — J45909 Unspecified asthma, uncomplicated: Secondary | ICD-10-CM | POA: Insufficient documentation

## 2018-07-18 DIAGNOSIS — R079 Chest pain, unspecified: Secondary | ICD-10-CM | POA: Insufficient documentation

## 2018-07-18 HISTORY — DX: Chest pain, unspecified: R07.9

## 2018-07-18 NOTE — Assessment & Plan Note (Signed)
Stable Has albuterol prn

## 2018-07-18 NOTE — Assessment & Plan Note (Signed)
Causing some chest tightness---  Pt has appointment with cardiology tomorrow If pain returns do not hesitate to go to ER

## 2018-07-18 NOTE — Assessment & Plan Note (Signed)
Pt has app with cardiology tomorrow D/w pt echo , etc but since he is seeing cardiology tomorrow will defer to them Pt to go to er if chest pain returns

## 2018-07-19 ENCOUNTER — Other Ambulatory Visit (HOSPITAL_BASED_OUTPATIENT_CLINIC_OR_DEPARTMENT_OTHER): Payer: BLUE CROSS/BLUE SHIELD

## 2018-07-26 ENCOUNTER — Ambulatory Visit (HOSPITAL_BASED_OUTPATIENT_CLINIC_OR_DEPARTMENT_OTHER)
Admission: RE | Admit: 2018-07-26 | Discharge: 2018-07-26 | Disposition: A | Discharge status: Home / Self Care | Payer: BLUE CROSS/BLUE SHIELD | Source: Ambulatory Visit | Attending: Cardiology | Admitting: Cardiology

## 2018-07-26 DIAGNOSIS — R9431 Abnormal electrocardiogram [ECG] [EKG]: Secondary | ICD-10-CM | POA: Insufficient documentation

## 2018-07-26 DIAGNOSIS — R079 Chest pain, unspecified: Secondary | ICD-10-CM | POA: Diagnosis not present

## 2018-07-26 NOTE — Progress Notes (Signed)
  Echocardiogram 2D Echocardiogram has been performed.  Dennis Kim [name] [name] 07/26/2018, 9:45 AM

## 2018-07-28 ENCOUNTER — Telehealth: Payer: Self-pay | Admitting: Emergency Medicine

## 2018-07-28 DIAGNOSIS — I421 Obstructive hypertrophic cardiomyopathy: Secondary | ICD-10-CM

## 2018-07-28 NOTE — Telephone Encounter (Signed)
Patient informed of results, and Dr. Wendy Poet recommendation for a cardiac mri. Patient verbally understands. Order has been placed for mri and will send staff message to [place] and Satira Sark as well as precert. Patient advised to call office if mri hasn't been scheduled within one week

## 2018-07-30 ENCOUNTER — Encounter: Payer: Self-pay | Admitting: Allergy

## 2018-07-30 ENCOUNTER — Ambulatory Visit: Payer: BLUE CROSS/BLUE SHIELD | Admitting: Allergy

## 2018-07-30 VITALS — BP 122/64 | HR 72 | Temp 97.9°F | Resp 16 | Ht 68.0 in | Wt 147.2 lb

## 2018-07-30 DIAGNOSIS — J3089 Other allergic rhinitis: Secondary | ICD-10-CM

## 2018-07-30 DIAGNOSIS — J302 Other seasonal allergic rhinitis: Secondary | ICD-10-CM | POA: Diagnosis not present

## 2018-07-30 DIAGNOSIS — R0609 Other forms of dyspnea: Secondary | ICD-10-CM

## 2018-07-30 DIAGNOSIS — R0600 Dyspnea, unspecified: Secondary | ICD-10-CM

## 2018-07-30 DIAGNOSIS — J4530 Mild persistent asthma, uncomplicated: Secondary | ICD-10-CM

## 2018-07-30 DIAGNOSIS — J4520 Mild intermittent asthma, uncomplicated: Secondary | ICD-10-CM | POA: Diagnosis not present

## 2018-07-30 HISTORY — DX: Other seasonal allergic rhinitis: J30.2

## 2018-07-30 HISTORY — DX: Mild persistent asthma, uncomplicated: J45.30

## 2018-07-30 HISTORY — DX: Other allergic rhinitis: J30.89

## 2018-07-30 MED ORDER — BUDESONIDE-FORMOTEROL FUMARATE 80-4.5 MCG/ACT IN AERO: 2.0000 | INHALATION_SPRAY | Freq: Two times a day (BID) | RESPIRATORY_TRACT | 3 refills | Status: DC

## 2018-07-30 NOTE — Assessment & Plan Note (Addendum)
Diagnosed with asthma at age 22 and has been fairly well controlled up until 1 month ago with rare  albuterol use as needed.  Having mold issues in his current apartment and noticed some respiratory issues with dizziness and chest pain.  80% of the symptoms resolved when lived with a friend for 1 week.  No improvement with prednisone or PPI.  Patient is following up with cardiology for some abnormalities noted on his cardiac work-up.  Currently using albuterol twice a day with minimal benefit.   Today's spirometry showed: Mild restriction with no improvement post bronchodilator treatment.  Start symbicort 80 2 puffs twice a day with spacer and rinse mouth afterwards for 2 weeks and monitor symptoms. If you feel better then continue medication.  If not improved then we will stop her inhaler.  Demonstrated proper use.  May use albuterol rescue inhaler 2 puffs or nebulizer every 4 to 6 hours as needed for shortness of breath, chest tightness, coughing, and wheezing. May use albuterol rescue inhaler 2 puffs 5 to 15 minutes prior to strenuous physical activities.

## 2018-07-30 NOTE — Patient Instructions (Addendum)
Mild persistent asthma Diagnosed with asthma at age 22 and has been fairly well controlled up until 1 month ago with rare  albuterol use as needed.  Having mold issues in his current apartment and noticed some respiratory issues with dizziness and chest pain.  80% of the symptoms resolved when lived with a friend for 1 week.  No improvement with prednisone or PPI.  Patient is following up with cardiology for some abnormalities noted on his cardiac work-up.  Currently using albuterol twice a day with minimal benefit.   Today's spirometry showed: Mild restriction with no improvement post bronchodilator treatment.  Start symbicort 80 2 puffs twice a day with spacer and rinse mouth afterwards for 2 weeks and monitor symptoms. If you feel better then continue medication.  If not improved then we will stop her inhaler.  Demonstrated proper use.  May use albuterol rescue inhaler 2 puffs or nebulizer every 4 to 6 hours as needed for shortness of breath, chest tightness, coughing, and wheezing. May use albuterol rescue inhaler 2 puffs 5 to 15 minutes prior to strenuous physical activities.  Seasonal and perennial allergic rhinitis Diagnosed with allergic rhinitis around age 39.  Last skin testing was over 5 years ago.  Results not available for review during office visit.  Currently on Xyzal 5 mg daily with unknown benefit. The mold exposure may be contributing to his symptoms and potentially flaring his asthma as well - will need additional diagnostic studies as below.  Unable to skin test today due to recent antihistamine intake.  Get blood work and I will call you with the results.   Lab Orders     Allergen Profile, Mold     Allergens Zone 2  Continue Xyzal 5 mg daily.  Discussed environmental control measures. Mold Control . Mold and fungi can grow on a variety of surfaces provided certain temperature and moisture conditions exist.  . Outdoor molds grow on plants, decaying vegetation and soil. The  major outdoor mold, Alternaria and Cladosporium, are found in very high numbers during hot and dry conditions. Generally, a late summer - fall peak is seen for common outdoor fungal spores. Rain will temporarily lower outdoor mold spore count, but counts rise rapidly when the rainy period ends. . The most important indoor molds are Aspergillus and Penicillium. Dark, humid and poorly ventilated basements are ideal sites for mold growth. The next most common sites of mold growth are the bathroom and the kitchen. Outdoor (Seasonal) Mold Control . Use air conditioning and keep windows closed. . Avoid exposure to decaying vegetation. Marland Kitchen Avoid leaf raking. . Avoid grain handling. . Consider wearing a face mask if working in moldy areas.  Indoor (Perennial) Mold Control  . Maintain humidity below 50%. . Get rid of mold growth on hard surfaces with water, detergent and, if necessary, 5% bleach (do not mix with other cleaners). Then dry the area completely. If mold covers an area more than 10 square feet, consider hiring an indoor environmental professional. . For clothing, washing with soap and water is best. If moldy items cannot be cleaned and dried, throw them away. . Remove sources e.g. contaminated carpets. . Repair and seal leaking roofs or pipes. Using dehumidifiers in damp basements may be helpful, but empty the water and clean units regularly to prevent mildew from forming. All rooms, especially basements, bathrooms and kitchens, require ventilation and cleaning to deter mold and mildew growth. Avoid carpeting on concrete or damp floors, and storing items in damp areas.  Dyspnea on exertion See plan for asthma.  Follow-up with cardiology as scheduled.  Return in about 2 months (around 09/29/2018).

## 2018-07-30 NOTE — Assessment & Plan Note (Addendum)
Diagnosed with allergic rhinitis around age 22.  Last skin testing was over 5 years ago.  Results not available for review during office visit.  Currently on Xyzal 5 mg daily with unknown benefit. The mold exposure may be contributing to his symptoms and potentially flaring his asthma as well - will need additional diagnostic studies as below.  Unable to skin test today due to recent antihistamine intake.  Get blood work and I will call you with the results.   Lab Orders     Allergen Profile, Mold     Allergens Zone 2  Continue Xyzal 5 mg daily.  Discussed environmental control measures. Mold Control . Mold and fungi can grow on a variety of surfaces provided certain temperature and moisture conditions exist.  . Outdoor molds grow on plants, decaying vegetation and soil. The major outdoor mold, Alternaria and Cladosporium, are found in very high numbers during hot and dry conditions. Generally, a late summer - fall peak is seen for common outdoor fungal spores. Rain will temporarily lower outdoor mold spore count, but counts rise rapidly when the rainy period ends. . The most important indoor molds are Aspergillus and Penicillium. Dark, humid and poorly ventilated basements are ideal sites for mold growth. The next most common sites of mold growth are the bathroom and the kitchen. Outdoor (Seasonal) Mold Control . Use air conditioning and keep windows closed. . Avoid exposure to decaying vegetation. Marland Kitchen Avoid leaf raking. . Avoid grain handling. . Consider wearing a face mask if working in moldy areas.  Indoor (Perennial) Mold Control  . Maintain humidity below 50%. . Get rid of mold growth on hard surfaces with water, detergent and, if necessary, 5% bleach (do not mix with other cleaners). Then dry the area completely. If mold covers an area more than 10 square feet, consider hiring an indoor environmental professional. . For clothing, washing with soap and water is best. If moldy items cannot  be cleaned and dried, throw them away. . Remove sources e.g. contaminated carpets. . Repair and seal leaking roofs or pipes. Using dehumidifiers in damp basements may be helpful, but empty the water and clean units regularly to prevent mildew from forming. All rooms, especially basements, bathrooms and kitchens, require ventilation and cleaning to deter mold and mildew growth. Avoid carpeting on concrete or damp floors, and storing items in damp areas.

## 2018-07-30 NOTE — Assessment & Plan Note (Addendum)
See plan for asthma.  Follow-up with cardiology as scheduled.

## 2018-07-30 NOTE — Progress Notes (Addendum)
New Patient Note  RE: Dennis Kim MRN: [id number] DOB: 1996/01/28 Date of Office Visit: 07/30/2018  Referring provider: Ann Held, * Primary care provider: Carollee Herter, Alferd Apa, DO  Chief Complaint: Shortness of Breath and Asthma  History of Present Illness: I had the pleasure of seeing Dennis Kim for initial evaluation at the [facility] [place] on 07/30/2018. He is a 22 y.o. male, who is referred here by Ann Held, DO for the evaluation of shortness of breath. Patient was used to be seen by Dr. Shaune Leeks in the past and last OV was over 5 years ago for asthma and allergies.  Asthma: Patient moved into a new apartment over 1 year ago and he is concerned about potential mold exposure as there was some leaky ceiling in July. Patient noticed some symptoms about 1 month ago in the form of chest pain, shortness of breath, and palpitations upon exertion including dizziness at rest about 2 weeks ago. Patient has seen PCP and was referred to cardiology. He is scheduled to get a cardiac MRI due to some abnormalities noted on his echo.   Patient moved out of the apartment for 1 week and felt about 80% better however the symptoms returned after going back to his apartment about 3 days. Patient has 3 roommates who are asymptomatic at this time.  He reports his usually asthma symptoms of chest tightness, shortness of breath, coughing, wheezing for 15 years. Current medications include albuterol prn which help. He reports not using aerochamber with asthma inhalers. He tried the following inhalers: Qvar as a child and albuterol nebulizer. Main asthma triggers are allergies, exercise, pet exposure. In the last month, frequency of asthma symptoms: daily. Frequency of nocturnal symptoms: 0x/month. Frequency of SABA use: 2 times a day for the past 2-3 weeks. Interference with physical activity: yes. Sleep is undisturbed. In the last 12 months, emergency room visits/urgent  care visits/doctor office visits or hospitalizations due to asthma: 3 times to the PCP. In the last 12 months, oral steroids courses: 1 course during this episode with no benefit. Lifetime history of hospitalization for asthma: no. Prior intubations: no. Asthma was diagnosed at age 33 by clinical history/spirometry. History of pneumonia: yes 3-4 times. He was evaluated by allergist in the past. Smoking exposure: no. Up to date with flu vaccine: not yet.  Rhinitis: He reports symptoms of rhinorrhea, coughing, itchy eyes/nose, nasal congestion. Symptoms have been going on for 15 years. The symptoms are present all year around with worsening in fall/winter. Other triggers include exposure to mold. Anosmia: no. Headache: no. No additional trigger factors including exposure to strong odors(perfumes/cleaning agents), change in temperature, spicy food and emotional situations. He has used xyzal 5mg  daily for the past 2 weeks with unsure improvement in symptoms. Sinus infections: 1-2 per year. Previous work up includes: last skin testing was over 5 years ago. Previous ENT evaluation: yes but not recently. Previous sinus imaging: no.  Assessment and Plan: [name] is a 22 y.o. male with: Mild persistent asthma Diagnosed with asthma at age 16 and has been fairly well controlled up until 1 month ago with rare  albuterol use as needed.  Having mold issues in his current apartment and noticed some respiratory issues with dizziness and chest pain.  80% of the symptoms resolved when lived with a friend for 1 week.  No improvement with prednisone or PPI.  Patient is following up with cardiology for some abnormalities noted on his  cardiac work-up.  Currently using albuterol twice a day with minimal benefit.   Today's spirometry showed: Mild restriction with no improvement post bronchodilator treatment.  Start symbicort 80 2 puffs twice a day with spacer and rinse mouth afterwards for 2 weeks and monitor symptoms. If you  feel better then continue medication.  If not improved then we will stop her inhaler.  Demonstrated proper use.  May use albuterol rescue inhaler 2 puffs or nebulizer every 4 to 6 hours as needed for shortness of breath, chest tightness, coughing, and wheezing. May use albuterol rescue inhaler 2 puffs 5 to 15 minutes prior to strenuous physical activities. Dyspnea on exertion  See plan for asthma.  Follow-up with cardiology as scheduled.  Seasonal and perennial allergic rhinitis Diagnosed with allergic rhinitis around age 37.  Last skin testing was over 5 years ago.  Results not available for review during office visit.  Currently on Xyzal 5 mg daily with unknown benefit. The mold exposure may be contributing to his symptoms and potentially flaring his asthma as well - will need additional diagnostic studies as below.  Unable to skin test today due to recent antihistamine intake.  Get blood work and I will call you with the results.   Lab Orders     Allergen Profile, Mold     Allergens Zone 2  Continue Xyzal 5 mg daily.  Discussed environmental control measures. Mold Control . Mold and fungi can grow on a variety of surfaces provided certain temperature and moisture conditions exist.  . Outdoor molds grow on plants, decaying vegetation and soil. The major outdoor mold, Alternaria and Cladosporium, are found in very high numbers during hot and dry conditions. Generally, a late summer - fall peak is seen for common outdoor fungal spores. Rain will temporarily lower outdoor mold spore count, but counts rise rapidly when the rainy period ends. . The most important indoor molds are Aspergillus and Penicillium. Dark, humid and poorly ventilated basements are ideal sites for mold growth. The next most common sites of mold growth are the bathroom and the kitchen. Outdoor (Seasonal) Mold Control . Use air conditioning and keep windows closed. . Avoid exposure to decaying vegetation. Marland Kitchen Avoid leaf  raking. . Avoid grain handling. . Consider wearing a face mask if working in moldy areas.  Indoor (Perennial) Mold Control  . Maintain humidity below 50%. . Get rid of mold growth on hard surfaces with water, detergent and, if necessary, 5% bleach (do not mix with other cleaners). Then dry the area completely. If mold covers an area more than 10 square feet, consider hiring an indoor environmental professional. . For clothing, washing with soap and water is best. If moldy items cannot be cleaned and dried, throw them away. . Remove sources e.g. contaminated carpets. . Repair and seal leaking roofs or pipes. Using dehumidifiers in damp basements may be helpful, but empty the water and clean units regularly to prevent mildew from forming. All rooms, especially basements, bathrooms and kitchens, require ventilation and cleaning to deter mold and mildew growth. Avoid carpeting on concrete or damp floors, and storing items in damp areas.  Return in about 2 months (around 09/29/2018).  Meds ordered this encounter  Medications  . budesonide-formoterol (SYMBICORT) 80-4.5 MCG/ACT inhaler    Sig: Inhale 2 puffs into the lungs 2 (two) times daily.    Dispense:  10.2 g    Refill:  3   Diagnostics: Spirometry:  Tracings reviewed. His effort: Good reproducible efforts. FVC: 3.74 L  FEV1: 2.69 L, 61 % predicted FEV1/FVC ratio: 72 % Interpretation: Mild restriction.  Albuterol nebulizer treatment given in clinic with no improvement. Please see scanned spirometry results for details.  Skin Testing: deferred due to recent antihistamine use.  Reviewed cardiology's notes from 07/16/2018. EKG 07/16/2018- early repolarization abnormalities Echocardiogram 07/01/2018- asymmetrical septal hypertrophy  Other allergy screening: Food allergy: no Medication allergy: no Hymenoptera allergy: no Urticaria: no Eczema:yes minimal on face. History of recurrent infections suggestive of immunodeficency: no  Past  Medical History: Patient Active Problem List   Diagnosis Date Noted  . Mild persistent asthma 07/30/2018  . Seasonal and perennial allergic rhinitis 07/30/2018  . Asthma 07/18/2018  . Chest pain 07/18/2018  . Dyspnea on exertion 07/16/2018  . Palpitations 07/16/2018  . Asymmetric septal hypertrophy ([place]) 07/16/2018  . Preventative health care 05/20/2016   Past Medical History:  Diagnosis Date  . Asthma    Past Surgical History: Past Surgical History:  Procedure Laterality Date  . NO PAST SURGERIES     Medication List:  Current Outpatient Medications  Medication Sig Dispense Refill  . albuterol (PROVENTIL HFA;VENTOLIN HFA) 108 (90 Base) MCG/ACT inhaler Inhale 2 puffs into the lungs every 6 (six) hours as needed for wheezing or shortness of breath. 1 Inhaler 2  . levocetirizine (XYZAL) 5 MG tablet Take 1 tablet (5 mg total) by mouth every evening. 30 tablet 5  . budesonide-formoterol (SYMBICORT) 80-4.5 MCG/ACT inhaler Inhale 2 puffs into the lungs 2 (two) times daily. 10.2 g 3  . omeprazole (PRILOSEC) 20 MG capsule Take 1 capsule (20 mg total) by mouth daily. (Patient not taking: Reported on 07/30/2018) 30 capsule 3   No current facility-administered medications for this visit.    Allergies: No Known Allergies  Social History: Social History   Socioeconomic History  . Marital status: Single    Spouse name: Not on file  . Number of children: Not on file  . Years of education: Not on file  . Highest education level: Not on file  Occupational History    Comment: student [employer]  Social Needs  . Financial resource strain: Not on file  . Food insecurity:    Worry: Not on file    Inability: Not on file  . Transportation needs:    Medical: Not on file    Non-medical: Not on file  Tobacco Use  . Smoking status: Never Smoker  . Smokeless tobacco: Never Used  Substance and Sexual Activity  . Alcohol use: Yes    Comment: rare  . Drug use: No  . Sexual activity: Never      Partners: Female  Lifestyle  . Physical activity:    Days per week: Not on file    Minutes per session: Not on file  . Stress: Not on file  Relationships  . Social connections:    Talks on phone: Not on file    Gets together: Not on file    Attends religious service: Not on file    Active member of club or organization: Not on file    Attends meetings of clubs or organizations: Not on file    Relationship status: Not on file  Other Topics Concern  . Not on file  Social History Narrative  . Not on file   Lives in unknown age apartment with 3 roommates. There are also cockroaches in the home. Smoking: Denies Occupation: Ship broker at [employer] History: Water Damage/mildew in the house: yes Carpet in the family room: yes  Carpet in the bedroom: yes Heating: Unsure Cooling: Unsure Pet: no  Family History: Family History  Problem Relation Age of Onset  . Brain cancer Maternal Grandmother   . Thyroid disease Father    Problem                               Relation Asthma                                   No  Eczema                                Mother  Food allergy                          No  Allergic rhino conjunctivitis     No   Review of Systems  Constitutional: Negative for appetite change, chills, fever and unexpected weight change.  HENT: Negative for congestion and rhinorrhea.   Eyes: Negative for itching.  Respiratory: Positive for chest tightness and shortness of breath. Negative for cough and wheezing.   Cardiovascular: Positive for chest pain.  Gastrointestinal: Negative for abdominal pain.  Genitourinary: Negative for difficulty urinating.  Skin: Negative for rash.  Allergic/Immunologic: Positive for environmental allergies. Negative for food allergies.  Neurological: Positive for dizziness. Negative for headaches.   Objective: BP 122/64   Pulse 72   Temp 97.9 F (36.6 C)   Resp 16   Ht 5\' 8"  (1.727 m)   Wt 147 lb 3.2 oz (66.8 kg)    BMI 22.38 kg/m  Body mass index is 22.38 kg/m. Physical Exam  Constitutional: He is oriented to person, place, and time. He appears well-developed and well-nourished.  HENT:  Head: Normocephalic and atraumatic.  Right Ear: External ear normal.  Left Ear: External ear normal.  Nose: Nose normal.  Mouth/Throat: Oropharynx is clear and moist.  Scarring of the TM bilaterally  Eyes: Conjunctivae and EOM are normal.  Neck: Neck supple.  Cardiovascular: Normal rate, regular rhythm and normal heart sounds. Exam reveals no gallop and no friction rub.  No murmur heard. Pulmonary/Chest: Effort normal and breath sounds normal. He has no wheezes. He has no rales.  Lymphadenopathy:    He has no cervical adenopathy.  Neurological: He is alert and oriented to person, place, and time.  Skin: Skin is warm. No rash noted.  Psychiatric: He has a normal mood and affect. His behavior is normal.  Nursing note and vitals reviewed.  The plan was reviewed with the patient/family, and all questions/concerned were addressed.  It was my pleasure to see Marco today and participate in his care. Please feel free to contact me with any questions or concerns.  Sincerely,  Rexene Alberts, DO [facility]: I was present as supervising physician for this patient encounter and agree with plan as outlined above by Dr. Barbie Banner, MD [facility]

## 2018-08-03 LAB — IGE+ALLERGENS ZONE 2(30)
Alternaria Alternata IgE: 3.75 kU/L — AB
Aspergillus Fumigatus IgE: <0.1 kU/L
Bahia Grass IgE: <0.1 kU/L
Bermuda Grass IgE: <0.1 kU/L
Cat Dander IgE: 2.92 kU/L — AB
Cockroach, American IgE: <0.1 kU/L
Common Silver Birch IgE: <0.1 kU/L
D001-IGE D PTERONYSSINUS: 8.3 kU/L — AB
D002-IGE D FARINAE: 5.73 kU/L — AB
Dog Dander IgE: 0.46 kU/L — AB
Elm, American IgE: <0.1 kU/L
IGE (IMMUNOGLOBULIN E), SERUM: 63 [IU]/mL (ref 6–495)
Johnson Grass IgE: <0.1 kU/L
Maple/Box Elder IgE: <0.1 kU/L
Mucor Racemosus IgE: <0.1 kU/L
Mugwort IgE Qn: <0.1 kU/L
Oak, White IgE: <0.1 kU/L
Plantain, English IgE: <0.1 kU/L
Ragweed, Short IgE: <0.1 kU/L
Sheep Sorrel IgE Qn: <0.1 kU/L
Stemphylium Herbarum IgE: 0.43 kU/L — AB
Sweet gum IgE RAST Ql: <0.1 kU/L
T006-IGE CEDAR, MOUNTAIN: 0.2 kU/L — AB
T041-IGE HICKORY, WHITE: 0.3 kU/L — AB
Timothy Grass IgE: 1.01 kU/L — AB
White Mulberry IgE: <0.1 kU/L

## 2018-08-03 LAB — ALLERGEN PROFILE, MOLD
Aureobasidi Pullulans IgE: <0.1 kU/L
M008-IGE SETOMELANOMMA ROSTRAT: 0.16 kU/L — AB
M014-IgE Epicoccum purpur: <0.1 kU/L
Phoma Betae IgE: 0.15 kU/L — AB

## 2018-08-04 ENCOUNTER — Encounter: Payer: Self-pay | Admitting: Allergy

## 2018-08-04 MED ORDER — FLUTICASONE PROPIONATE HFA 110 MCG/ACT IN AERO: 2.0000 | INHALATION_SPRAY | Freq: Two times a day (BID) | RESPIRATORY_TRACT | 5 refills | Status: DC

## 2018-08-09 ENCOUNTER — Telehealth: Payer: Self-pay | Admitting: Cardiology

## 2018-08-09 DIAGNOSIS — R079 Chest pain, unspecified: Secondary | ICD-10-CM

## 2018-08-09 NOTE — Telephone Encounter (Signed)
Patient is still waiting for MRI to be scheduled.

## 2018-08-09 NOTE — Telephone Encounter (Signed)
Staff message sent to scheduler to follow up on this.

## 2018-08-11 NOTE — Telephone Encounter (Signed)
Left message to informed patient I am still waiting for follow up on this

## 2018-08-11 NOTE — Telephone Encounter (Signed)
Also sent the status of this to patient in [place] message.

## 2018-08-13 NOTE — Telephone Encounter (Signed)
Patient scheduled for MRI. Left message on voicemail with date and time

## 2018-08-13 NOTE — Telephone Encounter (Signed)
Patient informed of MRI appointment.

## 2018-08-19 ENCOUNTER — Emergency Department (HOSPITAL_COMMUNITY)
Admission: EM | Admit: 2018-08-19 | Discharge: 2018-08-20 | Disposition: A | Discharge status: Home / Self Care | Payer: BLUE CROSS/BLUE SHIELD | Attending: Emergency Medicine | Admitting: Emergency Medicine

## 2018-08-19 ENCOUNTER — Other Ambulatory Visit: Payer: Self-pay

## 2018-08-19 ENCOUNTER — Emergency Department (HOSPITAL_COMMUNITY): Payer: BLUE CROSS/BLUE SHIELD

## 2018-08-19 ENCOUNTER — Encounter: Payer: Self-pay | Admitting: Family Medicine

## 2018-08-19 ENCOUNTER — Telehealth: Payer: Self-pay | Admitting: Allergy

## 2018-08-19 ENCOUNTER — Encounter (HOSPITAL_COMMUNITY): Payer: Self-pay

## 2018-08-19 DIAGNOSIS — J4530 Mild persistent asthma, uncomplicated: Secondary | ICD-10-CM | POA: Diagnosis not present

## 2018-08-19 DIAGNOSIS — Z79899 Other long term (current) drug therapy: Secondary | ICD-10-CM | POA: Insufficient documentation

## 2018-08-19 DIAGNOSIS — R0789 Other chest pain: Secondary | ICD-10-CM | POA: Diagnosis not present

## 2018-08-19 DIAGNOSIS — R079 Chest pain, unspecified: Secondary | ICD-10-CM

## 2018-08-19 LAB — CBC
HEMATOCRIT: 48.3 % (ref 39.0–52.0)
HEMOGLOBIN: 16.7 g/dL (ref 13.0–17.0)
MCH: 30.6 pg (ref 26.0–34.0)
MCHC: 34.6 g/dL (ref 30.0–36.0)
MCV: 88.5 fL (ref 80.0–100.0)
Platelets: 232 10*3/uL (ref 150–400)
RBC: 5.46 MIL/uL (ref 4.22–5.81)
RDW: 12.3 % (ref 11.5–15.5)
WBC: 5.2 10*3/uL (ref 4.0–10.5)
nRBC: 0 % (ref 0.0–0.2)

## 2018-08-19 LAB — BASIC METABOLIC PANEL
Anion gap: 9 (ref 5–15)
BUN: 12 mg/dL (ref 6–20)
CHLORIDE: 103 mmol/L (ref 98–111)
CO2: 29 mmol/L (ref 22–32)
Calcium: 9.9 mg/dL (ref 8.9–10.3)
Creatinine, Ser: 0.8 mg/dL (ref 0.61–1.24)
GFR calc Af Amer: >60 mL/min (ref >60)
GFR calc non Af Amer: >60 mL/min (ref >60)
GLUCOSE: 89 mg/dL (ref 70–99)
Potassium: 3.7 mmol/L (ref 3.5–5.1)
Sodium: 141 mmol/L (ref 135–145)

## 2018-08-19 LAB — I-STAT TROPONIN, ED: Troponin i, poc: 0 ng/mL (ref 0.00–0.08)

## 2018-08-19 MED ORDER — MONTELUKAST SODIUM 10 MG PO TABS: 10.0000 mg | ORAL_TABLET | Freq: Every day | ORAL | 5 refills | Status: DC

## 2018-08-19 NOTE — ED Provider Notes (Signed)
[facility] EMERGENCY DEPARTMENT Provider Note   CSN: [id number] Arrival date & time: 08/19/18  1753     History   Chief Complaint Chief Complaint  Patient presents with  . Chest Pain    HPI Dennis Kim is a 22 y.o. male.  The history is provided by the patient and medical records. No language interpreter was used.     22 year old male with history of asthma presenting for evaluation of chest pain.  Patient developed centralized chest tightness which has been waxing waning ongoing for nearly 2 months.  Pain worsening with movement, with exertion, and sometimes with taking deep breath.  He also endorsed lightheadedness and shortness of breath.  Pain is mild but present at this time.  Patient has been seen by his PCP as well as his allergist specialist along with cardiologist for his complaint.  He had an echocardiogram stress test performed on 07/27/2018.  Stress test negative but concerning for hypertrophy.  He have a cardiac MRI scheduled for November 12.  He denies any prior history of exertional syncope.  No significant family history of premature cardiac disease.  He denies using any kind of stimulants.  He does not smoke or drink.  He was taking allergies medication with minimal improvement of his symptoms.  Given the increased frequency of his chest discomfort, he is here today for further evaluation.  Patient denies any prior history of PE or DVT, no recent surgery, prolonged bedrest, active cancer, hemoptysis, leg swelling calf pain.  Past Medical History:  Diagnosis Date  . Asthma     Patient Active Problem List   Diagnosis Date Noted  . Mild persistent asthma 07/30/2018  . Seasonal and perennial allergic rhinitis 07/30/2018  . Asthma 07/18/2018  . Chest pain 07/18/2018  . Dyspnea on exertion 07/16/2018  . Palpitations 07/16/2018  . Asymmetric septal hypertrophy ([place]) 07/16/2018  . Preventative health care 05/20/2016    Past Surgical History:   Procedure Laterality Date  . NO PAST SURGERIES          Home Medications    Prior to Admission medications   Medication Sig Start Date End Date Taking? Authorizing Provider  albuterol (PROVENTIL HFA;VENTOLIN HFA) 108 (90 Base) MCG/ACT inhaler Inhale 2 puffs into the lungs every 6 (six) hours as needed for wheezing or shortness of breath. 06/30/18   Saguier, Percell Miller, PA-C  budesonide-formoterol (SYMBICORT) 80-4.5 MCG/ACT inhaler Inhale 2 puffs into the lungs 2 (two) times daily. 07/30/18   Garnet Sierras, DO  fluticasone (FLOVENT HFA) 110 MCG/ACT inhaler Inhale 2 puffs into the lungs 2 (two) times daily. 08/04/18   Garnet Sierras, DO  levocetirizine (XYZAL) 5 MG tablet Take 1 tablet (5 mg total) by mouth every evening. 07/15/18   Carollee Herter, Alferd Apa, DO  montelukast (SINGULAIR) 10 MG tablet Take 1 tablet (10 mg total) by mouth at bedtime. 08/19/18   Garnet Sierras, DO  omeprazole (PRILOSEC) 20 MG capsule Take 1 capsule (20 mg total) by mouth daily. Patient not taking: Reported on 07/30/2018 07/12/18   Saguier, Percell Miller, PA-C    Family History Family History  Problem Relation Age of Onset  . Brain cancer Maternal Grandmother   . Thyroid disease Father     Social History Social History   Tobacco Use  . Smoking status: Never Smoker  . Smokeless tobacco: Never Used  Substance Use Topics  . Alcohol use: Yes    Comment: rare  . Drug use: No  Allergies   Patient has no known allergies.   Review of Systems Review of Systems  All other systems reviewed and are negative.    Physical Exam Updated Vital Signs BP 124/80 (BP Location: Right Arm)   Pulse 69   Temp 99.4 F (37.4 C) (Oral)   Resp 16   SpO2 100%   Physical Exam  Constitutional: He appears well-developed and well-nourished. No distress.  HENT:  Head: Atraumatic.  Eyes: Conjunctivae are normal.  Neck: Neck supple.  Cardiovascular: Normal rate, regular rhythm and normal pulses.  Pulmonary/Chest: Effort normal and  breath sounds normal. No respiratory distress. He has no decreased breath sounds. He has no wheezes. He has no rhonchi. He has no rales.  Abdominal: Soft. There is no tenderness.  Musculoskeletal:       Right lower leg: He exhibits no edema.       Left lower leg: He exhibits no edema.  Neurological: He is alert.  Skin: No rash noted.  Psychiatric: He has a normal mood and affect.  Nursing note and vitals reviewed.    ED Treatments / Results  Labs (all labs ordered are listed, but only abnormal results are displayed) Labs Reviewed  BASIC METABOLIC PANEL  CBC  D-DIMER, QUANTITATIVE (NOT AT [facility])  I-STAT TROPONIN, ED  I-STAT TROPONIN, ED    EKG EKG Interpretation  Date/Time:  Thursday August 19 2018 17:54:54 EDT Ventricular Rate:  102 PR Interval:  150 QRS Duration: 100 QT Interval:  348 QTC Calculation: 453 R Axis:   102 Text Interpretation:  Sinus tachycardia Anterolateral infarct , age undetermined Abnormal ECG ? LVH no prior to compare with Confirmed by Aletta Edouard [phone]) on 08/19/2018 11:25:51 PM   ED ECG REPORT   Date: 08/19/2018  Rate: 102  Rhythm: sinus tachycardia  QRS Axis: right  Intervals: normal  ST/T Wave abnormalities: nonspecific ST changes  Conduction Disutrbances:none  Narrative Interpretation:   Old EKG Reviewed: unchanged  I have personally reviewed the EKG tracing and agree with the computerized printout as noted.   Radiology Dg Chest 2 View  Result Date: 08/19/2018 CLINICAL DATA:  Off and on chest pain x7 weeks. Pt c/o increasing center chest pain & a squeezing/tightness sensation in the chest x2 days. Pt also c/o SOB and painful inspiration.Pt states he has been seeing a cardiologist for this issue and currently has an MRI scheduled for this coming Wednesday, November 6th.Hx of asthma.Nonsmoker. EXAM: CHEST - 2 VIEW COMPARISON:  06/30/2018 FINDINGS: Lungs are clear. Heart size and mediastinal contours are within normal limits. No  effusion.  No pneumothorax. Visualized bones unremarkable. IMPRESSION: No acute cardiopulmonary disease. Electronically Signed   By: Lucrezia Europe M.D.   On: 08/19/2018 19:13    Procedures Procedures (including critical care time)  Medications Ordered in ED Medications - No data to display   Initial Impression / Assessment and Plan / ED Course  I have reviewed the triage vital signs and the nursing notes.  Pertinent labs & imaging results that were available during my care of the patient were reviewed by me and considered in my medical decision making (see chart for details).     BP 130/80   Pulse 82   Temp 99.4 F (37.4 C) (Oral)   Resp 19   SpO2 100%    Final Clinical Impressions(s) / ED Diagnoses   Final diagnoses:  Nonspecific chest pain    ED Discharge Orders    None     11:28 PM Patient with  recurrent chest pain ongoing for nearly 2 months.  He has been seen and evaluate multiple times by PCP, cardiologist, as well as allergist for his condition.  He does have a negative cardiac stress test recently however there is evidence of mild enlarged papillary muscles and septum and he is scheduled to have a cardiac MRI on November 12.  He is here due to concerns of his symptoms.  EKG is abnormal but unchanged from prior.  Patient is hemodynamically stable at this time.  We will repeat delta troponin, and obtain d-dimer.  12:42 AM Normal delta troponin, normal d-dimer.  Reassurance given, encouraged patient to follow-up with cardiac MRI on November 12 for further care.  Return precautions discussed.       Domenic Moras, PA-C 08/20/18 0049    Hayden Rasmussen, MD 08/20/18 1146

## 2018-08-19 NOTE — Telephone Encounter (Signed)
Dr. Kim please advise 

## 2018-08-19 NOTE — Telephone Encounter (Signed)
Patient was told to call back States the inhaler is not working - he has seen no change in his condition What else can he do??

## 2018-08-19 NOTE — Telephone Encounter (Signed)
Spoke with patient.  He was informed of new medication change and discontinuing inhalers.  Patient voiced understanding and was instructed to call back with further questions.

## 2018-08-19 NOTE — Telephone Encounter (Signed)
Pt should call cardiology----  Pt probably needs to go to ER

## 2018-08-19 NOTE — ED Provider Notes (Signed)
Patient placed in Quick Look pathway, seen and evaluated   Chief Complaint: chest pain  HPI: Dennis Kim is a 22 y.o. male who presents to the ED with chest pain. The pain is centralized chest tightness and patient reports symptoms started 2 months ago. Patient has been evaluated by a cardiologist and has MRI scheduled for next week. Patient reports that the pain sometimes gets worse with bending over. Patient reports he also went to his PCP for his asthma and was given a steroid inhaler but the symptoms have not changed.   ROS: Resp: shortness of breath  CV: chest pain, palpations  Physical Exam:  BP (!) 178/112 (BP Location: Right Arm)   Pulse (!) 104   Temp 98.7 F (37.1 C) (Oral)   Resp 16   SpO2 100%    Gen: No distress  Neuro: Awake and Alert  Skin: Warm and ry  Lungs: without wheezing or rales heard  Heart: tachycardia   Initiation of care has begun. The patient has been counseled on the process, plan, and necessity for staying for the completion/evaluation, and the remainder of the medical screening examination    Ashley Murrain, NP 08/19/18 1807    Tegeler, Gwenyth Allegra, MD 08/20/18 [phone]

## 2018-08-19 NOTE — ED Triage Notes (Signed)
Pt reports intermittent centralized chest tightness that has been going on for 2 months. Has been seen by a cardiologist for this and has a MRI scheduled for next week. States the pain sometimes gets worse when he bends over. Pt very anxious in triage but otherwise no distress noted.

## 2018-08-19 NOTE — Telephone Encounter (Signed)
Stop inhalers.   Start singulair 10mg  daily.

## 2018-08-20 LAB — I-STAT TROPONIN, ED: TROPONIN I, POC: 0.01 ng/mL (ref 0.00–0.08)

## 2018-08-20 LAB — D-DIMER, QUANTITATIVE (NOT AT ARMC)

## 2018-08-20 NOTE — Discharge Instructions (Addendum)
Please follow up for cardiac MRI on November 12th as previously scheduled.  Return if you have any concerns. You may take anti-inflammatory medication (aspirin) as needed for pain.  Avoid strenuous activity until cleared by your cardiologist.

## 2018-08-20 NOTE — ED Notes (Signed)
Patient verbalizes understanding of discharge instructions. Opportunity for questioning and answers were provided. Armband removed by staff, pt discharged from ED ambulatory.   

## 2018-08-23 ENCOUNTER — Ambulatory Visit: Payer: Self-pay | Admitting: Allergy

## 2018-08-23 ENCOUNTER — Ambulatory Visit: Payer: BLUE CROSS/BLUE SHIELD | Admitting: Cardiology

## 2018-08-23 ENCOUNTER — Telehealth: Payer: Self-pay

## 2018-08-23 NOTE — Telephone Encounter (Signed)
Author phoned pt. to f/u on ED admission for chest pain. Pt. stated he is taking ibuprofen as prescribed, with minimal relief. CP is intermittent, feeling "like chest is being squeezed", but is better since ED admission. Pt. stated he just started singular a few days ago. Med list updated, follow-up appointment made for 11/11 at 11:15AM with Dr. Etter Sjogren. Cardiac MRI scheduled for 11/6, heartcare appointment scheduled for 11/8. Dr. Etter Sjogren made aware.

## 2018-08-23 NOTE — Telephone Encounter (Signed)
noted 

## 2018-08-25 ENCOUNTER — Ambulatory Visit (HOSPITAL_COMMUNITY)
Admission: RE | Admit: 2018-08-25 | Discharge: 2018-08-25 | Disposition: A | Discharge status: Home / Self Care | Payer: BLUE CROSS/BLUE SHIELD | Source: Ambulatory Visit | Attending: Cardiology | Admitting: Cardiology

## 2018-08-25 DIAGNOSIS — R079 Chest pain, unspecified: Secondary | ICD-10-CM | POA: Insufficient documentation

## 2018-08-25 DIAGNOSIS — I421 Obstructive hypertrophic cardiomyopathy: Secondary | ICD-10-CM

## 2018-08-25 MED ORDER — GADOBUTROL 1 MMOL/ML IV SOLN
7.5000 mL | Freq: Once | INTRAVENOUS | Status: AC | PRN
  Administered 2018-08-25: 7.5 mL via INTRAVENOUS

## 2018-08-25 NOTE — Telephone Encounter (Signed)
Mri order changed per [facility]

## 2018-08-25 NOTE — Addendum Note (Signed)
Addended by: Ashok Norris on: 08/25/2018 09:01 AM   Modules accepted: Orders

## 2018-08-27 ENCOUNTER — Ambulatory Visit: Payer: BLUE CROSS/BLUE SHIELD | Admitting: Cardiology

## 2018-08-27 ENCOUNTER — Encounter: Payer: Self-pay | Admitting: Cardiology

## 2018-08-27 VITALS — BP 150/60 | HR 96 | Ht 67.0 in | Wt 153.0 lb

## 2018-08-27 DIAGNOSIS — J4530 Mild persistent asthma, uncomplicated: Secondary | ICD-10-CM | POA: Diagnosis not present

## 2018-08-27 DIAGNOSIS — I422 Other hypertrophic cardiomyopathy: Secondary | ICD-10-CM

## 2018-08-27 DIAGNOSIS — R072 Precordial pain: Secondary | ICD-10-CM | POA: Diagnosis not present

## 2018-08-27 MED ORDER — METOPROLOL TARTRATE 25 MG PO TABS: 25.0000 mg | ORAL_TABLET | Freq: Two times a day (BID) | ORAL | 3 refills | Status: DC

## 2018-08-27 NOTE — Progress Notes (Signed)
Cardiology Office Note:    Date:  08/27/2018   ID:  Dennis Kim, DOB 1996/05/26, MRN [id number]  PCP:  Carollee Herter, Alferd Apa, DO  Cardiologist:  Jenne Campus, MD    Referring MD: Carollee Herter, Alferd Apa, *   Chief Complaint  Patient presents with  . Follow up on testing  Still has some chest pain  History of Present Illness:    Dennis Kim is a 22 y.o. male with abnormal EKG also atypical chest pain.  Quite extensive evaluation has been done which revealed presence of hypertrophic cardiomyopathy.  The thickness wall is a 17 mm in the anterior wall there is no obstruction.  He still got some chest pain.  In the matter-of-fact just few days ago he ended up going to the emergency room and the sensation he has in the chest is continuous he wakes up with this and he goes to sleep with this there is no relieving or aggravating factors.  Stress test was done which was negative.  Biochemical markers negative.  I have no good explanation for his chest pain.  He looks like a very nervous person right now.  I brought him to the office today to explain to him diagnosis of cardiomyopathy.  We did talk about risk factors he does not have any worrisome risk factors and his history he passed out once in 2015 there was a very hot day he did not eat anything look more like vasovagal.  He does not have any family member who died suddenly.  His blood pressure response was normal during the stress test.  He does not have aneurysm on the MRI wall thickness is only 17, there is no gradient outflow tract.  He does not have any features that would be considered high risk for sudden cardiac death.  Still because of history of palpitations I will ask him to wear a 30 days monitor to make sure he does not have any nonsustained ventricular tachycardia.  I will also give him small dose of beta-blockers that hopefully will help him with his symptoms.  I warned him about potential side effect of this medication especially  in view of the fact that he gets some asthma.  Past Medical History:  Diagnosis Date  . Asthma     Past Surgical History:  Procedure Laterality Date  . NO PAST SURGERIES      Current Medications: Current Meds  Medication Sig  . levocetirizine (XYZAL) 5 MG tablet Take 1 tablet (5 mg total) by mouth every evening.  . montelukast (SINGULAIR) 10 MG tablet Take 1 tablet (10 mg total) by mouth at bedtime.     Allergies:   Patient has no known allergies.   Social History   Socioeconomic History  . Marital status: Single    Spouse name: Not on file  . Number of children: Not on file  . Years of education: Not on file  . Highest education level: Not on file  Occupational History    Comment: student [employer]  Social Needs  . Financial resource strain: Not on file  . Food insecurity:    Worry: Not on file    Inability: Not on file  . Transportation needs:    Medical: Not on file    Non-medical: Not on file  Tobacco Use  . Smoking status: Never Smoker  . Smokeless tobacco: Never Used  Substance and Sexual Activity  . Alcohol use: Yes    Comment: rare  .  Drug use: No  . Sexual activity: Never    Partners: Female  Lifestyle  . Physical activity:    Days per week: Not on file    Minutes per session: Not on file  . Stress: Not on file  Relationships  . Social connections:    Talks on phone: Not on file    Gets together: Not on file    Attends religious service: Not on file    Active member of club or organization: Not on file    Attends meetings of clubs or organizations: Not on file    Relationship status: Not on file  Other Topics Concern  . Not on file  Social History Narrative  . Not on file     Family History: The patient's family history includes Brain cancer in his maternal grandmother; Thyroid disease in his father. ROS:   Please see the history of present illness.    All 14 point review of systems negative except as described per history of present  illness  EKGs/Labs/Other Studies Reviewed:      Recent Labs: 08/19/2018: BUN 12; Creatinine, Ser 0.80; Hemoglobin 16.7; Platelets 232; Potassium 3.7; Sodium 141  Recent Lipid Panel    Component Value Date/Time   CHOL 181 05/21/2016 0802   TRIG 144.0 05/21/2016 0802   HDL 45.10 05/21/2016 0802   CHOLHDL 4 05/21/2016 0802   VLDL 28.8 05/21/2016 0802   LDLCALC 107 (H) 05/21/2016 0802    Physical Exam:    VS:  BP (!) 150/60   Pulse 96   Ht 5\' 7"  (1.702 m)   Wt 153 lb (69.4 kg)   SpO2 98%   BMI 23.96 kg/m     Wt Readings from Last 3 Encounters:  08/27/18 153 lb (69.4 kg)  07/30/18 147 lb 3.2 oz (66.8 kg)  07/16/18 148 lb (67.1 kg)     GEN:  Well nourished, well developed in no acute distress HEENT: Normal NECK: No JVD; No carotid bruits LYMPHATICS: No lymphadenopathy CARDIAC: RRR, no murmurs, no rubs, no gallops RESPIRATORY:  Clear to auscultation without rales, wheezing or rhonchi  ABDOMEN: Soft, non-tender, non-distended MUSCULOSKELETAL:  No edema; No deformity  SKIN: Warm and dry LOWER EXTREMITIES: no swelling NEUROLOGIC:  Alert and oriented x 3 PSYCHIATRIC:  Normal affect   ASSESSMENT:    1. Asymmetric septal hypertrophy (HCC)   2. Mild persistent asthma without complication   3. Precordial pain    PLAN:    In order of problems listed above:  1. Hypertrophic cardiomyopathy without obstruction.  Plan as outlined above we will initiate very small dose of beta-blocker for his symptoms.  I will ask him to have 30 days monitor to make sure he is not have any arrhythmia. 2. Mild persistent asthma no wheezes he used very rarely MDI albuterol.  I will put him a small dose of beta-blocker warned him about the fact that can make his asthma worse if that is because he stopped that medication. 3. Precordial chest pain again beta-blocker will be given small dose   Medication Adjustments/Labs and Tests Ordered: Current medicines are reviewed at length with the patient  today.  Concerns regarding medicines are outlined above.  No orders of the defined types were placed in this encounter.  Medication changes: No orders of the defined types were placed in this encounter.   Signed, Park Liter, MD, [facility] 08/27/2018 11:03 AM    [place]

## 2018-08-27 NOTE — Patient Instructions (Signed)
Medication Instructions:  Your physician has recommended you make the following change in your medication:  START: Metoprolol 25 mg twice daily.   If you need a refill on your cardiac medications before your next appointment, please call your pharmacy.   Lab work: None.  If you have labs (blood work) drawn today and your tests are completely normal, you will receive your results only by: Marland Kitchen MyChart Message (if you have MyChart) OR . A paper copy in the mail If you have any lab test that is abnormal or we need to change your treatment, we will call you to review the results.  Testing/Procedures: Your physician has recommended that you wear an event monitor. Event monitors are medical devices that record the heart's electrical activity. Doctors most often [place] these monitors to diagnose arrhythmias. Arrhythmias are problems with the speed or rhythm of the heartbeat. The monitor is a small, portable device. You can wear one while you do your normal daily activities. This is usually used to diagnose what is causing palpitations/syncope (passing out). Wear for 30 days     Follow-Up: At [facility], you and your health needs are our priority.  As part of our continuing mission to provide you with exceptional heart care, we have created designated Provider Care Teams.  These Care Teams include your primary Cardiologist (physician) and Advanced Practice Providers (APPs -  Physician Assistants and Nurse Practitioners) who all work together to provide you with the care you need, when you need it. You will need a follow up appointment in 2 months.  Please call our office 2 months in advance to schedule this appointment.  You may see Jenne Campus, MD or another member of our [place] Provider Team in [place]: Shirlee More, MD . Jyl Heinz, MD  Any Other Special Instructions Will Be Listed Below (If Applicable).   Cardiac Event Monitoring A cardiac event monitor is a small recording  device that is used to detect abnormal heart rhythms (arrhythmias). The monitor is used to record your heart rhythm when you have symptoms, such as:  Fast heartbeats (palpitations), such as heart racing or fluttering.  Dizziness.  Fainting or light-headedness.  Unexplained weakness.  Some monitors are wired to electrodes placed on your chest. Electrodes are flat, sticky disks that attach to your skin. Other monitors may be hand-held or worn on the wrist. The monitor can be worn for up to 30 days. If the monitor is attached to your chest, a technician will prepare your chest for the electrode placement and show you how to work the monitor. Take time to practice using the monitor before you leave the office. Make sure you understand how to send the information from the monitor to your health care provider. In some cases, you may need to use a landline telephone instead of a cell phone. What are the risks? Generally, this device is safe to use, but it possible that the skin under the electrodes will become irritated. How to use your cardiac event monitor  Wear your monitor at all times, except when you are in water: ? Do not let the monitor get wet. ? Take the monitor off when you bathe. Do not swim or use a hot tub with it on.  Keep your skin clean. Do not put body lotion or moisturizer on your chest.  Change the electrodes as told by your health care provider or any time they stop sticking to your skin. You may need to use medical tape  to keep them on.  Try to put the electrodes in slightly different places on your chest to help prevent skin irritation. They must remain in the area under your left breast and in the upper right section of your chest.  Make sure the monitor is safely clipped to your clothing or in a location close to your body that your health care provider recommends.  Press the button to record as soon as you feel heart-related symptoms, such  as: ? Dizziness. ? Weakness. ? Light-headedness. ? Palpitations. ? Thumping or pounding in your chest. ? Shortness of breath. ? Unexplained weakness.  Keep a diary of your activities, such as walking, doing chores, and taking medicine. It is very important to note what you were doing when you pushed the button to record your symptoms. This will help your health care provider determine what might be contributing to your symptoms.  Send the recorded information as recommended by your health care provider. It may take some time for your health care provider to process the results.  Change the batteries as told by your health care provider.  Keep electronic devices away from your monitor. This includes: ? Tablets. ? MP3 players. ? Cell phones.  While wearing your monitor you should avoid: ? Electric blankets. ? Armed forces operational officer. ? Electric toothbrushes. ? Microwave ovens. ? Magnets. ? Metal detectors. Get help right away if:  You have chest pain.  You have extreme difficulty breathing or shortness of breath.  You develop a very fast heartbeat that persists.  You develop dizziness that does not go away.  You faint or constantly feel like you are about to faint. Summary  A cardiac event monitor is a small recording device that is used to help detect abnormal heart rhythms (arrhythmias).  The monitor is used to record your heart rhythm when you have heart-related symptoms.  Make sure you understand how to send the information from the monitor to your health care provider.  It is important to press the button on the monitor when you have any heart-related symptoms.  Keep a diary of your activities, such as walking, doing chores, and taking medicine. It is very important to note what you were doing when you pushed the button to record your symptoms. This will help your health care provider learn what might be causing your symptoms. This information is not intended to replace  advice given to you by your health care provider. Make sure you discuss any questions you have with your health care provider. Document Released: 07/15/2008 Document Revised: 09/20/2016 Document Reviewed: 09/20/2016 Elsevier Interactive Patient Education  2017 Elsevier Inc.  Metoprolol tablets What is this medicine? METOPROLOL (me TOE proe lole) is a beta-blocker. Beta-blockers reduce the workload on the heart and help it to beat more regularly. This medicine is used to treat high blood pressure and to prevent chest pain. It is also used to after a heart attack and to prevent an additional heart attack from occurring. This medicine may be used for other purposes; ask your health care provider or pharmacist if you have questions. COMMON BRAND NAME(S): Lopressor What should I tell my health care provider before I take this medicine? They need to know if you have any of these conditions: -diabetes -heart or vessel disease like slow heart rate, worsening heart failure, heart block, sick sinus syndrome or Raynaud's disease -kidney disease -liver disease -lung or breathing disease, like asthma or emphysema -pheochromocytoma -thyroid disease -an unusual or allergic reaction to metoprolol, other  beta-blockers, medicines, foods, dyes, or preservatives -pregnant or trying to get pregnant -breast-feeding How should I use this medicine? Take this medicine by mouth with a drink of water. Follow the directions on the prescription label. Take this medicine immediately after meals. Take your doses at regular intervals. Do not take more medicine than directed. Do not stop taking this medicine suddenly. This could lead to serious heart-related effects. Talk to your pediatrician regarding the use of this medicine in children. Special care may be needed. Overdosage: If you think you have taken too much of this medicine contact a poison control center or emergency room at once. NOTE: This medicine is only for  you. Do not share this medicine with others. What if I miss a dose? If you miss a dose, take it as soon as you can. If it is almost time for your next dose, take only that dose. Do not take double or extra doses. What may interact with this medicine? This medicine may interact with the following medications: -certain medicines for blood pressure, heart disease, irregular heart beat -certain medicines for depression like monoamine oxidase (MAO) inhibitors, fluoxetine, or paroxetine -clonidine -dobutamine -epinephrine -isoproterenol -reserpine This list may not describe all possible interactions. Give your health care provider a list of all the medicines, herbs, non-prescription drugs, or dietary supplements you use. Also tell them if you smoke, drink alcohol, or use illegal drugs. Some items may interact with your medicine. What should I watch for while using this medicine? Visit your doctor or health care professional for regular check ups. Contact your doctor right away if your symptoms worsen. Check your blood pressure and pulse rate regularly. Ask your health care professional what your blood pressure and pulse rate should be, and when you should contact them. You may get drowsy or dizzy. Do not drive, use machinery, or do anything that needs mental alertness until you know how this medicine affects you. Do not sit or stand up quickly, especially if you are an older patient. This reduces the risk of dizzy or fainting spells. Contact your doctor if these symptoms continue. Alcohol may interfere with the effect of this medicine. Avoid alcoholic drinks. What side effects may I notice from receiving this medicine? Side effects that you should report to your doctor or health care professional as soon as possible: -allergic reactions like skin rash, itching or hives -cold or numb hands or feet -depression -difficulty breathing -faint -fever with sore throat -irregular heartbeat, chest  pain -rapid weight gain -swollen legs or ankles Side effects that usually do not require medical attention (report to your doctor or health care professional if they continue or are bothersome): -anxiety or nervousness -change in sex drive or performance -dry skin -headache -nightmares or trouble sleeping -short term memory loss -stomach upset or diarrhea -unusually tired This list may not describe all possible side effects. Call your doctor for medical advice about side effects. You may report side effects to FDA at 1-800-FDA-1088. Where should I keep my medicine? Keep out of the reach of children. Store at room temperature between 15 and 30 degrees C (59 and 86 degrees F). Throw away any unused medicine after the expiration date. NOTE: This sheet is a summary. It may not cover all possible information. If you have questions about this medicine, talk to your doctor, pharmacist, or health care provider.  2018 Elsevier/Gold Standard (2013-06-10 14:40:36)

## 2018-08-30 ENCOUNTER — Ambulatory Visit: Payer: BLUE CROSS/BLUE SHIELD | Admitting: Family Medicine

## 2018-08-30 ENCOUNTER — Encounter: Payer: Self-pay | Admitting: Family Medicine

## 2018-08-30 VITALS — BP 115/65 | HR 86 | Temp 98.3°F | Resp 16 | Ht 68.0 in | Wt 154.0 lb

## 2018-08-30 DIAGNOSIS — R1013 Epigastric pain: Secondary | ICD-10-CM

## 2018-08-30 LAB — COMPREHENSIVE METABOLIC PANEL
ALBUMIN: 4.7 g/dL (ref 3.5–5.2)
ALT: 35 U/L (ref 0–53)
AST: 20 U/L (ref 0–37)
Alkaline Phosphatase: 84 U/L (ref 39–117)
BILIRUBIN TOTAL: 0.4 mg/dL (ref 0.2–1.2)
BUN: 19 mg/dL (ref 6–23)
CALCIUM: 9.9 mg/dL (ref 8.4–10.5)
CO2: 34 mEq/L — ABNORMAL HIGH (ref 19–32)
CREATININE: 0.82 mg/dL (ref 0.40–1.50)
Chloride: 102 mEq/L (ref 96–112)
GFR: 125.35 mL/min (ref >60.00)
Glucose, Bld: 83 mg/dL (ref 70–99)
Potassium: 3.9 mEq/L (ref 3.5–5.1)
Sodium: 141 mEq/L (ref 135–145)
Total Protein: 7.4 g/dL (ref 6.0–8.3)

## 2018-08-30 LAB — CBC WITH DIFFERENTIAL/PLATELET
BASOS ABS: 0 10*3/uL (ref 0.0–0.1)
Basophils Relative: 0.4 % (ref 0.0–3.0)
EOS ABS: 0 10*3/uL (ref 0.0–0.7)
Eosinophils Relative: 1 % (ref 0.0–5.0)
HCT: 44.2 % (ref 39.0–52.0)
HEMOGLOBIN: 15.6 g/dL (ref 13.0–17.0)
LYMPHS PCT: 42.5 % (ref 12.0–46.0)
Lymphs Abs: 2 10*3/uL (ref 0.7–4.0)
MCHC: 35.2 g/dL (ref 30.0–36.0)
MCV: 90.2 fl (ref 78.0–100.0)
Monocytes Absolute: 0.6 10*3/uL (ref 0.1–1.0)
Monocytes Relative: 13.1 % — ABNORMAL HIGH (ref 3.0–12.0)
Neutro Abs: 2.1 10*3/uL (ref 1.4–7.7)
Neutrophils Relative %: 43 % (ref 43.0–77.0)
Platelets: 178 10*3/uL (ref 150.0–400.0)
RBC: 4.9 Mil/uL (ref 4.22–5.81)
RDW: 12.6 % (ref 11.5–15.5)
WBC: 4.8 10*3/uL (ref 4.0–10.5)

## 2018-08-30 LAB — TSH: TSH: 3.02 u[IU]/mL (ref 0.35–4.50)

## 2018-08-30 LAB — H. PYLORI ANTIBODY, IGG: H Pylori IgG: NEGATIVE

## 2018-08-30 MED ORDER — FAMOTIDINE 40 MG PO TABS: 40.0000 mg | ORAL_TABLET | Freq: Every day | ORAL | 3 refills | Status: DC

## 2018-08-30 MED ORDER — ALUM & MAG HYDROXIDE-SIMETH 200-200-20 MG/5ML PO SUSP
30.0000 mL | Freq: Once | ORAL | Status: AC
  Administered 2018-08-30: 30 mL via ORAL

## 2018-08-30 NOTE — Patient Instructions (Addendum)
Nonspecific Chest Pain Chest pain can be caused by many different conditions. There is always a chance that your pain could be related to something serious, such as a heart attack or a blood clot in your lungs. Chest pain can also be caused by conditions that are not life-threatening. If you have chest pain, it is very important to follow up with your health care provider. What are the causes? Causes of this condition include:  Heartburn.  Pneumonia or bronchitis.  Anxiety or stress.  Inflammation around your heart (pericarditis) or lung (pleuritis or pleurisy).  A blood clot in your lung.  A collapsed lung (pneumothorax). This can develop suddenly on its own (spontaneous pneumothorax) or from trauma to the chest.  Shingles infection (varicella-zoster virus).  Heart attack.  Damage to the bones, muscles, and cartilage that make up your chest wall. This can include: ? Bruised bones due to injury. ? Strained muscles or cartilage due to frequent or repeated coughing or overwork. ? Fracture to one or more ribs. ? Sore cartilage due to inflammation (costochondritis).  What increases the risk? Risk factors for this condition may include:  Activities that increase your risk for trauma or injury to your chest.  Respiratory infections or conditions that cause frequent coughing.  Medical conditions or overeating that can cause heartburn.  Heart disease or family history of heart disease.  Conditions or health behaviors that increase your risk of developing a blood clot.  Having had chicken pox (varicella zoster).  What are the signs or symptoms? Chest pain can feel like:  Burning or tingling on the surface of your chest or deep in your chest.  Crushing, pressure, aching, or squeezing pain.  Dull or sharp pain that is worse when you move, cough, or take a deep breath.  Pain that is also felt in your back, neck, shoulder, or arm, or pain that spreads to any of these  areas.  Your chest pain may come and go, or it may stay constant. How is this diagnosed? Lab tests or other studies may be needed to find the cause of your pain. Your health care provider may have you take a test called an ECG (electrocardiogram). An ECG records your heartbeat patterns at the time the test is performed. You may also have other tests, such as:  Transthoracic echocardiogram (TTE). In this test, sound waves are used to create a picture of the heart structures and to look at how blood flows through your heart.  Transesophageal echocardiogram (TEE).This is a more advanced imaging test that takes images from inside your body. It allows your health care provider to see your heart in finer detail.  Cardiac monitoring. This allows your health care provider to monitor your heart rate and rhythm in real time.  Holter monitor. This is a portable device that records your heartbeat and can help to diagnose abnormal heartbeats. It allows your health care provider to track your heart activity for several days, if needed.  Stress tests. These can be done through exercise or by taking medicine that makes your heart beat more quickly.  Blood tests.  Other imaging tests.  How is this treated? Treatment depends on what is causing your chest pain. Treatment may include:  Medicines. These may include: ? Acid blockers for heartburn. ? Anti-inflammatory medicine. ? Pain medicine for inflammatory conditions. ? Antibiotic medicine, if an infection is present. ? Medicines to dissolve blood clots. ? Medicines to treat coronary artery disease (CAD).  Supportive care for conditions that  do not require medicines. This may include: ? Resting. ? Applying heat or cold packs to injured areas. ? Limiting activities until pain decreases.  Follow these instructions at home: Medicines  If you were prescribed an antibiotic, take it as told by your health care provider. Do not stop taking the  antibiotic even if you start to feel better.  Take over-the-counter and prescription medicines only as told by your health care provider. Lifestyle  Do not use any products that contain nicotine or tobacco, such as cigarettes and e-cigarettes. If you need help quitting, ask your health care provider.  Do not drink alcohol.  Make lifestyle changes as directed by your health care provider. These may include: ? Getting regular exercise. Ask your health care provider to suggest some activities that are safe for you. ? Eating a heart-healthy diet. A registered dietitian can help you to learn healthy eating options. ? Maintaining a healthy weight. ? Managing diabetes, if necessary. ? Reducing stress, such as with yoga or relaxation techniques. General instructions  Avoid any activities that bring on chest pain.  If heartburn is the cause for your chest pain, raise (elevate) the head of your bed about 6 inches (15 cm) by putting blocks under the legs. Sleeping with more pillows does not effectively relieve heartburn because it only changes the position of your head.  Keep all follow-up visits as told by your health care provider. This is important. This includes any further testing if your chest pain does not go away. Contact a health care provider if:  Your chest pain does not go away.  You have a rash with blisters on your chest.  You have a fever.  You have chills. Get help right away if:  Your chest pain is worse.  You have a cough that gets worse, or you cough up blood.  You have severe pain in your abdomen.  You have severe weakness.  You faint.  You have sudden, unexplained chest discomfort.  You have sudden, unexplained discomfort in your arms, back, neck, or jaw.  You have shortness of breath at any time.  You suddenly start to sweat, or your skin gets clammy.  You feel nauseous or you vomit.  You suddenly feel light-headed or dizzy.  Your heart begins to beat  quickly, or it feels like it is skipping beats. These symptoms may represent a serious problem that is an emergency. Do not wait to see if the symptoms will go away. Get medical help right away. Call your local emergency services (911 in the [place] Do not drive yourself to the hospital. This information is not intended to replace advice given to you by your health care provider. Make sure you discuss any questions you have with your health care provider. Document Released: 07/16/2005 Document Revised: 06/30/2016 Document Reviewed: 06/30/2016 Elsevier Interactive Patient Education  2017 [place] for Gastroesophageal Reflux Disease, Adult When you have gastroesophageal reflux disease (GERD), the foods you eat and your eating habits are very important. Choosing the right foods can help ease your discomfort. What guidelines do I need to follow?  Choose fruits, vegetables, whole grains, and low-fat dairy products.  Choose low-fat meat, fish, and poultry.  Limit fats such as oils, salad dressings, butter, nuts, and avocado.  Keep a food diary. This helps you identify foods that cause symptoms.  Avoid foods that cause symptoms. These may be different for everyone.  Eat small meals often instead of 3 large meals a day.  Eat your meals slowly, in a place where you are relaxed.  Limit fried foods.  Cook foods using methods other than frying.  Avoid drinking alcohol.  Avoid drinking large amounts of liquids with your meals.  Avoid bending over or lying down until 2-3 hours after eating. What foods are not recommended? These are some foods and drinks that may make your symptoms worse: Vegetables Tomatoes. Tomato juice. Tomato and spaghetti sauce. Chili peppers. Onion and garlic. Horseradish. Fruits Oranges, grapefruit, and lemon (fruit and juice). Meats High-fat meats, fish, and poultry. This includes hot dogs, ribs, ham, sausage, salami, and bacon. Dairy Whole milk  and chocolate milk. Sour cream. Cream. Butter. Ice cream. Cream cheese. Drinks Coffee and tea. Bubbly (carbonated) drinks or energy drinks. Condiments Hot sauce. Barbecue sauce. Sweets/Desserts Chocolate and cocoa. Donuts. Peppermint and spearmint. Fats and Oils High-fat foods. This includes [place] fries and potato chips. Other Vinegar. Strong spices. This includes black pepper, white pepper, red pepper, cayenne, curry powder, cloves, ginger, and chili powder. The items listed above may not be a complete list of foods and drinks to avoid. Contact your dietitian for more information. This information is not intended to replace advice given to you by your health care provider. Make sure you discuss any questions you have with your health care provider. Document Released: 04/06/2012 Document Revised: 03/13/2016 Document Reviewed: 08/10/2013 Elsevier Interactive Patient Education  2017 [employer].

## 2018-08-30 NOTE — Progress Notes (Signed)
Patient ID: Dennis Kim, male    DOB: 25-Oct-1995  Age: 22 y.o. MRN: [id number]    Subjective:  Subjective  HPI Dennis Kim presents for midepigastric tenderness-- he had mri chest yesterday and was told he had septal hypertrophy -- he will have a cardiac monitor done as well.  His father is with him   He is under a lot of stress with school. He admits to feeling worse with eating too much and burping more.   Review of Systems  Constitutional: Negative for appetite change, diaphoresis, fatigue and unexpected weight change.  Eyes: Negative for pain, redness and visual disturbance.  Respiratory: Negative for cough, chest tightness, shortness of breath and wheezing.   Cardiovascular: Negative for chest pain, palpitations and leg swelling.  Gastrointestinal: Positive for abdominal pain. Negative for anal bleeding, blood in stool, constipation, diarrhea, nausea, rectal pain and vomiting.  Endocrine: Negative for cold intolerance, heat intolerance, polydipsia, polyphagia and polyuria.  Genitourinary: Negative for difficulty urinating, dysuria and frequency.  Neurological: Negative for dizziness, light-headedness, numbness and headaches.    History Past Medical History:  Diagnosis Date  . Asthma     He has a past surgical history that includes No past surgeries.   His family history includes Brain cancer in his maternal grandmother; Thyroid disease in his father.He reports that he has never smoked. He has never used smokeless tobacco. He reports that he drinks alcohol. He reports that he does not use drugs.  Current Outpatient Medications on File Prior to Visit  Medication Sig Dispense Refill  . levocetirizine (XYZAL) 5 MG tablet Take 1 tablet (5 mg total) by mouth every evening. 30 tablet 5  . metoprolol tartrate (LOPRESSOR) 25 MG tablet Take 1 tablet (25 mg total) by mouth 2 (two) times daily. 60 tablet 3  . montelukast (SINGULAIR) 10 MG tablet Take 1 tablet (10 mg total) by mouth at  bedtime. 30 tablet 5   No current facility-administered medications on file prior to visit.      Objective:  Objective  Physical Exam  Constitutional: He is oriented to person, place, and time. Vital signs are normal. He appears well-developed and well-nourished. He is sleeping.  HENT:  Head: Normocephalic and atraumatic.  Mouth/Throat: Oropharynx is clear and moist.  Eyes: Pupils are equal, round, and reactive to light. EOM are normal.  Neck: Normal range of motion. Neck supple. No thyromegaly present.  Cardiovascular: Normal rate and regular rhythm.  No murmur heard. Pulmonary/Chest: Effort normal and breath sounds normal. No respiratory distress. He has no wheezes. He has no rales. He exhibits no tenderness.  Abdominal: He exhibits no mass. There is tenderness in the epigastric area. There is no rebound and no guarding.    Musculoskeletal: He exhibits no edema or tenderness.  Neurological: He is alert and oriented to person, place, and time.  Skin: Skin is warm and dry.  Psychiatric: He has a normal mood and affect. His behavior is normal. Judgment and thought content normal.  Nursing note and vitals reviewed.  BP 115/65 (BP Location: Left Arm, Cuff Size: Normal)   Pulse 86   Temp 98.3 F (36.8 C) (Oral)   Resp 16   Ht 5\' 8"  (1.727 m)   Wt 154 lb (69.9 kg)   SpO2 100%   BMI 23.42 kg/m  Wt Readings from Last 3 Encounters:  08/30/18 154 lb (69.9 kg)  08/27/18 153 lb (69.4 kg)  07/30/18 147 lb 3.2 oz (66.8 kg)     Lab  Results  Component Value Date   WBC 5.2 08/19/2018   HGB 16.7 08/19/2018   HCT 48.3 08/19/2018   PLT 232 08/19/2018   GLUCOSE 89 08/19/2018   CHOL 181 05/21/2016   TRIG 144.0 05/21/2016   HDL 45.10 05/21/2016   LDLCALC 107 (H) 05/21/2016   ALT 20 05/21/2016   AST 17 05/21/2016   NA 141 08/19/2018   K 3.7 08/19/2018   CL 103 08/19/2018   CREATININE 0.80 08/19/2018   BUN 12 08/19/2018   CO2 29 08/19/2018   TSH 2.06 05/21/2016    Mr Cardiac  Morphology W Wo Contrast  Result Date: 08/25/2018 CLINICAL DATA:  Assess for hypertrophic cardiomyopathy. EXAM: CARDIAC MRI TECHNIQUE: The patient was scanned on a 1.5 Tesla GE magnet. A dedicated cardiac coil was used. Functional imaging was done using Fiesta sequences. 2,3, and 4 chamber views were done to assess for RWMA's. Modified Simpson's rule using a short axis stack was used to calculate an ejection fraction on a dedicated work Conservation officer, nature. The patient received 8 cc of Gadavist. After 10 minutes inversion recovery sequences were used to assess for infiltration and scar tissue. CONTRAST:  8 cc Gadavist FINDINGS: Limited images of the lung fields showed no gross abnormalities. Normal left ventricular size. There is a focal area of basal anteroseptal and basal anterior moderate asymmetric hypertrophy, with wall measuring 17 mm. EF 68% with normal wall motion. Normal right ventricular size and systolic function, EF 26%. Normal left and right atrial sizes. There was no mitral valve systolic anterior motion noted and no mitral regurgitation. Trileaflet aortic valve with no stenosis or regurgitation. Delayed enhancement imaging showed no myocardial late gadolinium enhancement (LGE). Measurements: LVEDV 153 mL LVSV 104 mL LVEF 68% RVEDV 133 mL RVSV 52 mL RVEF 61% IMPRESSION: 1. Normal LV size and systolic function, EF 33%. There is focal basal anteroseptal/basal anterior moderate asymmetric hypertrophy. There is no mitral valve systolic anterior motion. Would recommend echo to assess for LV outflow gradient though there did not appear to be turbulence across the LVOT. 2.  Normal RV size and systolic function, EF 35%. 3.  No myocardial late gadolinium enhancement (LGE). This study could be consistent with hypertrophic cardiomyopathy. Hypertrophy is not marked (>30 mm) and there is no myocardial LGE, so sudden death risk based on LV morphology does not appear to be high. Dalton Mclean  Electronically Signed   By: Loralie Champagne M.D.   On: 08/25/2018 21:04     Assessment & Plan:  Plan  I am having Dennis Kim start on famotidine. I am also having him maintain his levocetirizine, montelukast, and metoprolol tartrate. We administered alum & mag hydroxide-simeth.  Meds ordered this encounter  Medications  . alum & mag hydroxide-simeth (MAALOX/MYLANTA) 200-200-20 MG/5ML suspension 30 mL  . famotidine (PEPCID) 40 MG tablet    Sig: Take 1 tablet (40 mg total) by mouth daily.    Dispense:  30 tablet    Refill:  3    Problem List Items Addressed This Visit    None    Visit Diagnoses    Epigastric abdominal pain    -  Primary   Relevant Medications   alum & mag hydroxide-simeth (MAALOX/MYLANTA) 200-200-20 MG/5ML suspension 30 mL (Completed)   famotidine (PEPCID) 40 MG tablet   Other Relevant Orders   H. pylori antibody, IgG   Comprehensive metabolic panel   CBC with Differential/Platelet   TSH    if no relief with pepcid and  labs neg---  Refer to GI   Follow-up: Return if symptoms worsen or fail to improve.  Ann Held, DO

## 2018-08-31 ENCOUNTER — Other Ambulatory Visit (HOSPITAL_COMMUNITY): Payer: BLUE CROSS/BLUE SHIELD

## 2018-08-31 ENCOUNTER — Ambulatory Visit (INDEPENDENT_AMBULATORY_CARE_PROVIDER_SITE_OTHER): Payer: BLUE CROSS/BLUE SHIELD

## 2018-08-31 ENCOUNTER — Other Ambulatory Visit: Payer: Self-pay | Admitting: Cardiology

## 2018-08-31 DIAGNOSIS — R000 Tachycardia, unspecified: Secondary | ICD-10-CM

## 2018-08-31 DIAGNOSIS — I422 Other hypertrophic cardiomyopathy: Secondary | ICD-10-CM

## 2018-09-06 ENCOUNTER — Encounter: Payer: Self-pay | Admitting: Family Medicine

## 2018-09-06 NOTE — Telephone Encounter (Signed)
Please refer to gi and let pt know

## 2018-09-07 ENCOUNTER — Encounter: Payer: Self-pay | Admitting: Family Medicine

## 2018-09-07 ENCOUNTER — Encounter: Payer: Self-pay | Admitting: Gastroenterology

## 2018-09-07 ENCOUNTER — Other Ambulatory Visit: Payer: Self-pay | Admitting: *Deleted

## 2018-09-07 DIAGNOSIS — R1013 Epigastric pain: Secondary | ICD-10-CM

## 2018-09-08 NOTE — Telephone Encounter (Signed)
3 week is actually very quick-- he can ask them to put him on a cancellation list  Is the pecid helping at all We can add omeprazole 40 mg daily if pepcid is helping some

## 2018-09-10 NOTE — Telephone Encounter (Signed)
Would you like for [place] to call or try somewhere else

## 2018-09-10 NOTE — Telephone Encounter (Signed)
Dennis Kim --- is there anything we can do to get him in quicker ?

## 2018-09-10 NOTE — Telephone Encounter (Signed)
Gwen--- any way to get him into GI sooner?

## 2018-10-04 ENCOUNTER — Encounter: Payer: Self-pay | Admitting: Gastroenterology

## 2018-10-04 ENCOUNTER — Encounter

## 2018-10-04 ENCOUNTER — Ambulatory Visit: Payer: BLUE CROSS/BLUE SHIELD | Admitting: Gastroenterology

## 2018-10-04 VITALS — BP 108/80 | HR 88 | Ht 67.0 in | Wt 151.8 lb

## 2018-10-04 DIAGNOSIS — R197 Diarrhea, unspecified: Secondary | ICD-10-CM

## 2018-10-04 DIAGNOSIS — R079 Chest pain, unspecified: Secondary | ICD-10-CM | POA: Diagnosis not present

## 2018-10-04 MED ORDER — IBUPROFEN 600 MG PO TABS: 600.0000 mg | ORAL_TABLET | Freq: Three times a day (TID) | ORAL | 1 refills | Status: AC

## 2018-10-04 MED ORDER — PANTOPRAZOLE SODIUM 40 MG PO TBEC: 40.0000 mg | DELAYED_RELEASE_TABLET | Freq: Every day | ORAL | 11 refills | Status: DC

## 2018-10-04 NOTE — Progress Notes (Signed)
History of Present Illness: This is a 22 year old male referred by Carollee Herter, Alferd Apa, * DO for the evaluation of chest pressure.  He is accompanied by his aunt who is a Marine scientist.  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  His aunt relates that his mother is being treated for cancer.  He has a 46-month history of sternal area chest pressure that is fairly constant and it is frequently associated with chest tightness across his upper chest.  It worsens with exertion and worsens with meals.  He has alternating constipation and diarrhea for the past several months and notes no clear precipitants of diarrhea.  He tried Nexium 20 mg for 2 weeks and famotidine 40 mg daily for 1 month without any change in symptoms.  He was treated with a course of NSAIDs without change in symptoms.  He has been evaluated by his PCP and cardiology.  Hypertrophic cardiomyopathy/asymmetric septal hypertrophy was diagnosed but was not felt to be clinically significant.  He is no longer taking any of his prescribed medications as he feels that not helped his symptoms.  He has mild asthma but did not feel that Singulair helped his symptoms. Denies weight loss, abdominal pain, change in stool caliber, melena, hematochezia, nausea, vomiting, dysphagia, reflux symptoms.   H pylori negative.  CMP, CBC, TSH  No Known Allergies Outpatient Medications Prior to Visit  Medication Sig Dispense Refill  . famotidine (PEPCID) 40 MG tablet Take 1 tablet (40 mg total) by mouth daily. 30 tablet 3  . levocetirizine (XYZAL) 5 MG tablet Take 1 tablet (5 mg total) by mouth every evening. 30 tablet 5  . metoprolol tartrate (LOPRESSOR) 25 MG tablet Take 1 tablet (25 mg total) by mouth 2 (two) times daily. 60 tablet 3  . montelukast (SINGULAIR) 10 MG tablet Take 1 tablet (10 mg total) by mouth at bedtime. 30 tablet 5   No facility-administered medications prior to  visit.    Past Medical History:  Diagnosis Date  . Asthma    Past Surgical History:  Procedure Laterality Date  . NO PAST SURGERIES     Social History   Socioeconomic History  . Marital status: Single    Spouse name: Not on file  . Number of children: Not on file  . Years of education: Not on file  . Highest education level: Not on file  Occupational History    Comment: student [employer]  Social Needs  . Financial resource strain: Not on file  . Food insecurity:    Worry: Not on file    Inability: Not on file  . Transportation needs:    Medical: Not on file    Non-medical: Not on file  Tobacco Use  . Smoking status: Never Smoker  . Smokeless tobacco: Never Used  Substance and Sexual Activity  . Alcohol use: Yes    Comment: rare  . Drug use: No  . Sexual activity: Never    Partners: Female  Lifestyle  . Physical activity:    Days per week: Not on file    Minutes per session: Not on file  . Stress: Not on file  Relationships  . Social connections:    Talks on phone: Not on file    Gets together: Not on file    Attends religious service: Not on file    Active member of club or organization:  Not on file    Attends meetings of clubs or organizations: Not on file    Relationship status: Not on file  Other Topics Concern  . Not on file  Social History Narrative  . Not on file   Family History  Problem Relation Age of Onset  . Brain cancer Maternal Grandmother   . Thyroid disease Father   . Breast cancer Paternal Grandmother   . Kidney disease Paternal Uncle        Review of Systems: Pertinent positive and negative review of systems were noted in the above HPI section. All other review of systems were otherwise negative.   Physical Exam: General: Well developed, well nourished, no acute distress Head: Normocephalic and atraumatic Eyes:  sclerae anicteric, EOMI Ears: Normal auditory acuity Mouth: No deformity or lesions Neck: Supple, no masses or  thyromegaly Lungs: Clear throughout to auscultation Chest: Tenderness over sternum and xiphoid process. Heart: Regular rate and rhythm; no murmurs, rubs or bruits Abdomen: Soft, non tender and non distended. No masses, hepatosplenomegaly or hernias noted. Normal Bowel sounds Rectal: Not done Musculoskeletal: Symmetrical with no gross deformities  Skin: No lesions on visible extremities Pulses:  Normal pulses noted Extremities: No clubbing, cyanosis, edema or deformities noted Neurological: Alert oriented x 4, grossly nonfocal Cervical Nodes:  No significant cervical adenopathy Inguinal Nodes: No significant inguinal adenopathy Psychological:  Alert and cooperative.  Anxious   Assessment and Recommendations:  1.  Persistent chest pressure.  A component is musculoskeletal with sternal and xiphoid process tenderness that reproduces at least one component of his symptoms.  His symptoms are not typical for a gastrointestinal etiology however without an etiology identified and with no response to prior treatment further evaluation is indicated.  Rule out GERD, ulcer, cholelithiasis, anxiety.  Begin ibuprofen 600 mg p.o. 3 times daily for 2 weeks.  Begin pantoprazole 40 mg daily.  Schedule abdominal ultrasound.  Schedule EGD. The risks (including bleeding, perforation, infection, missed lesions, medication reactions and possible hospitalization or surgery if complications occur), benefits, and alternatives to endoscopy with possible biopsy and possible dilation were discussed with the patient and they consent to proceed.    2.  Intermittent diarrhea and constipation without abdominal pain.  Patient will continue to evaluate for dietary stressors.   cc: Ann Held, DO [address] [place], [place] [id number]

## 2018-10-04 NOTE — Patient Instructions (Signed)
We have sent the following medications to your pharmacy for you to pick up at your convenience: pantoprazole and ibuprofen.  You have been scheduled for an abdominal ultrasound at [facility] (1st floor of hospital) on 10/07/18 at 8:30am. Please arrive 15 minutes prior to your appointment for registration. Make certain not to have anything to eat or drink 6 hours prior to your appointment. Should you need to reschedule your appointment, please contact radiology at [phone]. This test typically takes about 30 minutes to perform.  You have been scheduled for an endoscopy. Please follow written instructions given to you at your visit today. If you use inhalers (even only as needed), please bring them with you on the day of your procedure. Your physician has requested that you go to www.startemmi.com and enter the access code given to you at your visit today. This web site gives a general overview about your procedure. However, you should still follow specific instructions given to you by our office regarding your preparation for the procedure.  Thank you for choosing me and [facility].  Pricilla Riffle. Dagoberto Ligas., MD., [facility] Regal

## 2018-10-05 ENCOUNTER — Encounter: Payer: Self-pay | Admitting: Gastroenterology

## 2018-10-05 ENCOUNTER — Ambulatory Visit (AMBULATORY_SURGERY_CENTER): Payer: BLUE CROSS/BLUE SHIELD | Admitting: Gastroenterology

## 2018-10-05 VITALS — BP 119/71 | HR 94 | Temp 98.9°F | Resp 10 | Ht 67.0 in | Wt 151.0 lb

## 2018-10-05 DIAGNOSIS — R079 Chest pain, unspecified: Secondary | ICD-10-CM | POA: Diagnosis not present

## 2018-10-05 DIAGNOSIS — K2970 Gastritis, unspecified, without bleeding: Secondary | ICD-10-CM

## 2018-10-05 DIAGNOSIS — K219 Gastro-esophageal reflux disease without esophagitis: Secondary | ICD-10-CM

## 2018-10-05 DIAGNOSIS — K2950 Unspecified chronic gastritis without bleeding: Secondary | ICD-10-CM | POA: Diagnosis not present

## 2018-10-05 DIAGNOSIS — K208 Other esophagitis: Secondary | ICD-10-CM | POA: Diagnosis not present

## 2018-10-05 MED ORDER — SODIUM CHLORIDE 0.9 % IV SOLN: 500.0000 mL | Freq: Once | INTRAVENOUS | Status: DC

## 2018-10-05 NOTE — Progress Notes (Signed)
PT taken to PACU. Monitors in place. VSS. Report given to RN. 

## 2018-10-05 NOTE — Op Note (Signed)
[facility] Patient Name: Dennis Kim Procedure Date: 10/05/2018 10:29 AM MRN: [id number] Endoscopist: Ladene Artist , MD Age: 22 Referring MD:  Date of Birth: Jun 10, 1996 Gender: Male Account #: [id number] Procedure:                Upper GI endoscopy Indications:              Unexplained chest pain Medicines:                Monitored Anesthesia Care Procedure:                Pre-Anesthesia Assessment:                           - Prior to the procedure, a History and Physical                            was performed, and patient medications and                            allergies were reviewed. The patient's tolerance of                            previous anesthesia was also reviewed. The risks                            and benefits of the procedure and the sedation                            options and risks were discussed with the patient.                            All questions were answered, and informed consent                            was obtained. Prior Anticoagulants: The patient has                            taken no previous anticoagulant or antiplatelet                            agents. ASA Grade Assessment: II - A patient with                            mild systemic disease. After reviewing the risks                            and benefits, the patient was deemed in                            satisfactory condition to undergo the procedure.                           After obtaining informed consent, the endoscope was  passed under direct vision. Throughout the                            procedure, the patient's blood pressure, pulse, and                            oxygen saturations were monitored continuously. The                            Endoscope was introduced through the mouth, and                            advanced to the second part of duodenum. The upper                            GI endoscopy was accomplished  without difficulty.                            The patient tolerated the procedure well. Scope In: Scope Out: Findings:                 LA Grade A (one or more mucosal breaks less than 5                            mm, not extending between tops of 2 mucosal folds)                            esophagitis with no bleeding was found at the                            gastroesophageal junction.                           The Z-line was variable and was found 38 cm from                            the incisors. Biopsies were taken with a cold                            forceps for histology.                           The exam of the esophagus was otherwise normal.                           Diffuse mildly erythematous mucosa without bleeding                            was found in the entire examined stomach. Biopsies                            were taken with a cold forceps for histology.  The exam of the stomach was otherwise normal.                           Patchy moderately erythematous mucosa without                            active bleeding and with no stigmata of bleeding                            was found in the duodenal bulb.                           The exam of the duodenum was otherwise normal. Complications:            No immediate complications. Estimated Blood Loss:     Estimated blood loss was minimal. Impression:               - LA Grade A reflux esophagitis.                           - Z-line variable, 38 cm from the incisors.                            Biopsied.                           - Erythematous mucosa in the stomach. Biopsied.                           - Erythematous duodenopathy. Recommendation:           - Patient has a contact number available for                            emergencies. The signs and symptoms of potential                            delayed complications were discussed with the                            patient. Return to  normal activities tomorrow.                            Written discharge instructions were provided to the                            patient.                           - Resume previous diet.                           - Follow antirelux measures.                           - Continue present medications.                           -  Await pathology results.                           - Return to GI office in 6 weeks. Ladene Artist, MD 10/05/2018 10:45:23 AM This report has been signed electronically.

## 2018-10-05 NOTE — Progress Notes (Signed)
Pt's states no medical or surgical changes since previsit or office visit. 

## 2018-10-05 NOTE — Patient Instructions (Signed)
Copy of GERD protocol (anti-reflux regimen).  YOU HAD AN ENDOSCOPIC PROCEDURE TODAY AT [facility]:   Refer to the procedure report that was given to you for any specific questions about what was found during the examination.  If the procedure report does not answer your questions, please call your gastroenterologist to clarify.  If you requested that your care partner not be given the details of your procedure findings, then the procedure report has been included in a sealed envelope for you to review at your convenience later.  YOU SHOULD EXPECT: Some feelings of bloating in the abdomen. Passage of more gas than usual.  Walking can help get rid of the air that was put into your GI tract during the procedure and reduce the bloating. If you had a lower endoscopy (such as a colonoscopy or flexible sigmoidoscopy) you may notice spotting of blood in your stool or on the toilet paper. If you underwent a bowel prep for your procedure, you may not have a normal bowel movement for a few days.  Please Note:  You might notice some irritation and congestion in your nose or some drainage.  This is from the oxygen used during your procedure.  There is no need for concern and it should clear up in a day or so.  SYMPTOMS TO REPORT IMMEDIATELY:    Following upper endoscopy (EGD)  Vomiting of blood or coffee ground material  New chest pain or pain under the shoulder blades  Painful or persistently difficult swallowing  New shortness of breath  Fever of 100F or higher  Black, tarry-looking stools  For urgent or emergent issues, a gastroenterologist can be reached at any hour by calling [phone].   DIET:  We do recommend a small meal at first, but then you may proceed to your regular diet.  Drink plenty of fluids but you should avoid alcoholic beverages for 24 hours.  ACTIVITY:  You should plan to take it easy for the rest of today and you should NOT DRIVE or use heavy machinery until  tomorrow (because of the sedation medicines used during the test).    FOLLOW UP: Our staff will call the number listed on your records the next business day following your procedure to check on you and address any questions or concerns that you may have regarding the information given to you following your procedure. If we do not reach you, we will leave a message.  However, if you are feeling well and you are not experiencing any problems, there is no need to return our call.  We will assume that you have returned to your regular daily activities without incident.  If any biopsies were taken you will be contacted by phone or by letter within the next 1-3 weeks.  Please call us at [phone] if you have not heard about the biopsies in 3 weeks.    SIGNATURES/CONFIDENTIALITY: You and/or your care partner have signed paperwork which will be entered into your electronic medical record.  These signatures attest to the fact that that the information above on your After Visit Summary has been reviewed and is understood.  Full responsibility of the confidentiality of this discharge information lies with you and/or your care-partner.

## 2018-10-05 NOTE — Progress Notes (Signed)
Called to room to assist during endoscopic procedure.  Patient ID and intended procedure confirmed with present staff. Received instructions for my participation in the procedure from the performing physician.  

## 2018-10-07 ENCOUNTER — Ambulatory Visit (HOSPITAL_COMMUNITY)
Admission: RE | Admit: 2018-10-07 | Discharge: 2018-10-07 | Disposition: A | Discharge status: Home / Self Care | Payer: BLUE CROSS/BLUE SHIELD | Source: Ambulatory Visit | Attending: Gastroenterology | Admitting: Gastroenterology

## 2018-10-07 DIAGNOSIS — R1010 Upper abdominal pain, unspecified: Secondary | ICD-10-CM | POA: Diagnosis not present

## 2018-10-07 DIAGNOSIS — R079 Chest pain, unspecified: Secondary | ICD-10-CM

## 2018-10-07 DIAGNOSIS — K824 Cholesterolosis of gallbladder: Secondary | ICD-10-CM | POA: Insufficient documentation

## 2018-10-08 ENCOUNTER — Encounter: Payer: Self-pay | Admitting: Gastroenterology

## 2018-10-08 ENCOUNTER — Telehealth: Payer: Self-pay | Admitting: Gastroenterology

## 2018-10-08 NOTE — Telephone Encounter (Signed)
Pt returning a call to discuss results.

## 2018-10-08 NOTE — Telephone Encounter (Signed)
See results notes for details.  

## 2018-10-11 ENCOUNTER — Ambulatory Visit: Payer: BLUE CROSS/BLUE SHIELD | Admitting: Family Medicine

## 2018-10-11 ENCOUNTER — Encounter: Payer: Self-pay | Admitting: Family Medicine

## 2018-10-11 ENCOUNTER — Other Ambulatory Visit (HOSPITAL_COMMUNITY)
Admission: RE | Admit: 2018-10-11 | Discharge: 2018-10-11 | Disposition: A | Discharge status: Home / Self Care | Payer: BLUE CROSS/BLUE SHIELD | Source: Ambulatory Visit | Attending: Family Medicine | Admitting: Family Medicine

## 2018-10-11 VITALS — BP 122/78 | HR 100 | Temp 98.2°F | Resp 16 | Ht 67.0 in | Wt 153.8 lb

## 2018-10-11 DIAGNOSIS — R350 Frequency of micturition: Secondary | ICD-10-CM | POA: Diagnosis not present

## 2018-10-11 DIAGNOSIS — R071 Chest pain on breathing: Secondary | ICD-10-CM

## 2018-10-11 HISTORY — DX: Frequency of micturition: R35.0

## 2018-10-11 LAB — POC URINALSYSI DIPSTICK (AUTOMATED)
Blood, UA: NEGATIVE
Glucose, UA: NEGATIVE
Ketones, UA: NEGATIVE
Leukocytes, UA: NEGATIVE
Nitrite, UA: NEGATIVE
PROTEIN UA: POSITIVE — AB
Spec Grav, UA: 1.02 (ref 1.010–1.025)
UROBILINOGEN UA: 0.2 U/dL
pH, UA: 6.5 (ref 5.0–8.0)

## 2018-10-11 LAB — D-DIMER, QUANTITATIVE: D-Dimer, Quant: 0.21 mcg/mL FEU (ref <0.50)

## 2018-10-11 NOTE — Assessment & Plan Note (Signed)
Urine normal and testicular exam normal  Refer to urology---  Mother present and requested this

## 2018-10-11 NOTE — Assessment & Plan Note (Signed)
Pt has had spirometry x2 with allergist --- normal per pt Had asthma as a child 2 cxr normal Egd showed some gastritis-- he is on protonix and is not any better Check CT chest  F/u GI and allergy

## 2018-10-11 NOTE — Progress Notes (Signed)
Patient ID: Dennis Kim, male    DOB: 16-Apr-1996  Age: 22 y.o. MRN: [id number]    Subjective:  Subjective  HPI Maxey T Kim presents for f/u chest pain -- his mother was dx with cancer recently and school has been stressful but the pt does not feel the stress if affecting him.  He is out of school now and symptoms are no different.  Mom is worried about the stress as well.    He also c/o urinary frequency and pain in scrotum and urinary leakage over 1 week. No pain with urination.  Pt is sexually active with one male and has no symptoms of STD.    Review of Systems  Constitutional: Negative for appetite change, diaphoresis, fatigue and unexpected weight change.  Eyes: Negative for pain, redness and visual disturbance.  Respiratory: Negative for cough, chest tightness, shortness of breath and wheezing.   Cardiovascular: Negative for chest pain, palpitations and leg swelling.  Endocrine: Negative for cold intolerance, heat intolerance, polydipsia, polyphagia and polyuria.  Genitourinary: Positive for difficulty urinating, frequency and testicular pain. Negative for decreased urine volume, discharge, dysuria, genital sores, hematuria, penile pain, penile swelling and urgency.  Neurological: Negative for dizziness, light-headedness, numbness and headaches.    History Past Medical History:  Diagnosis Date  . Asthma     He has a past surgical history that includes No past surgeries.   His family history includes Brain cancer in his maternal grandmother; Breast cancer in his paternal grandmother; Kidney disease in his paternal uncle; Thyroid disease in his father.He reports that he has never smoked. He has never used smokeless tobacco. He reports current alcohol use. He reports that he does not use drugs.  Current Outpatient Medications on File Prior to Visit  Medication Sig Dispense Refill  . ibuprofen (ADVIL,MOTRIN) 600 MG tablet Take 1 tablet (600 mg total) by mouth 3 (three) times  daily for 14 days. 42 tablet 1  . pantoprazole (PROTONIX) 40 MG tablet Take 1 tablet (40 mg total) by mouth daily. 30 tablet 11   No current facility-administered medications on file prior to visit.      Objective:  Objective  Physical Exam Vitals signs and nursing note reviewed.  Constitutional:      General: He is sleeping.     Appearance: He is well-developed.  HENT:     Head: Normocephalic and atraumatic.  Eyes:     Pupils: Pupils are equal, round, and reactive to light.  Neck:     Musculoskeletal: Normal range of motion and neck supple.     Thyroid: No thyromegaly.  Cardiovascular:     Rate and Rhythm: Normal rate and regular rhythm.     Heart sounds: No murmur.  Pulmonary:     Effort: Pulmonary effort is normal. No respiratory distress.     Breath sounds: Normal breath sounds. No wheezing or rales.  Chest:     Chest wall: No tenderness.  Genitourinary:    Penis: Normal.      Scrotum/Testes: Normal.        Right: Mass, tenderness or swelling not present. Right testis is descended.        Left: Mass, tenderness or swelling not present. Left testis is descended.  Musculoskeletal:        General: No tenderness.  Skin:    General: Skin is warm and dry.  Neurological:     Mental Status: He is oriented to person, place, and time.  Psychiatric:  Behavior: Behavior normal.        Thought Content: Thought content normal.        Judgment: Judgment normal.    BP 122/78 (BP Location: Left Arm, Cuff Size: Normal)   Pulse 100   Temp 98.2 F (36.8 C) (Oral)   Resp 16   Ht 5\' 7"  (1.702 m)   Wt 153 lb 12.8 oz (69.8 kg)   SpO2 99%   BMI 24.09 kg/m  Wt Readings from Last 3 Encounters:  10/11/18 153 lb 12.8 oz (69.8 kg)  10/05/18 151 lb (68.5 kg)  10/04/18 151 lb 12.8 oz (68.9 kg)     Lab Results  Component Value Date   WBC 4.8 08/30/2018   HGB 15.6 08/30/2018   HCT 44.2 08/30/2018   PLT 178.0 08/30/2018   GLUCOSE 83 08/30/2018   CHOL 181 05/21/2016   TRIG  144.0 05/21/2016   HDL 45.10 05/21/2016   LDLCALC 107 (H) 05/21/2016   ALT 35 08/30/2018   AST 20 08/30/2018   NA 141 08/30/2018   K 3.9 08/30/2018   CL 102 08/30/2018   CREATININE 0.82 08/30/2018   BUN 19 08/30/2018   CO2 34 (H) 08/30/2018   TSH 3.02 08/30/2018    US Abdomen Complete  Result Date: 10/07/2018 CLINICAL DATA:  Chest and upper abdominal pain EXAM: ABDOMEN ULTRASOUND COMPLETE COMPARISON:  None. FINDINGS: Gallbladder: Multiple polyps are noted along the gallbladder wall ranging in size from as small as 2 mm to as large as 5 mm. There are no echogenic foci which move and shadow as is expected with gallstones. There is no gallbladder wall thickening or pericholecystic fluid. No sonographic Murphy sign noted by sonographer. Common bile duct: Diameter: 5 mm. No intrahepatic, common hepatic, or common bile duct dilatation. Liver: No focal lesion identified. Within normal limits in parenchymal echogenicity. Portal vein is patent on color Doppler imaging with normal direction of blood flow towards the liver. IVC: No abnormality visualized. Pancreas: No pancreatic mass or inflammatory focus. Spleen: Size and appearance within normal limits. Right Kidney: Length: 11.2 cm. Echogenicity within normal limits. No mass or hydronephrosis visualized. Left Kidney: Length: 10.5 cm. Echogenicity within normal limits. No mass or hydronephrosis visualized. Abdominal aorta: No aneurysm visualized. Other findings: No demonstrable ascites. IMPRESSION: 1. Multiple small polyps in the gallbladder, largest measuring 5 mm. Per consensus guidelines, polyps of this small size do not warrant additional imaging surveillance. No gallstones, gallbladder wall thickening or pericholecystic fluid evident. 2.  Study otherwise unremarkable. Electronically Signed   By: Lowella Grip III M.D.   On: 10/07/2018 11:39     Assessment & Plan:  Plan  I am having Sebasthian T. Kim maintain his pantoprazole and ibuprofen.  No  orders of the defined types were placed in this encounter.   Problem List Items Addressed This Visit      Unprioritized   Chest pain - Primary    Pt has had spirometry x2 with allergist --- normal per pt Had asthma as a child 2 cxr normal Egd showed some gastritis-- he is on protonix and is not any better Check CT chest  F/u GI and allergy      Relevant Orders   CT Chest Wo Contrast   D-Dimer, Quantitative (Completed)   Urinary frequency    Urine normal and testicular exam normal  Refer to urology---  Mother present and requested this      Relevant Orders   Urine cytology ancillary only([place])   Ambulatory referral  to Urology   POCT Urinalysis Dipstick (Automated) (Completed)      Follow-up: No follow-ups on file.  Ann Held, DO

## 2018-10-11 NOTE — Patient Instructions (Signed)

## 2018-10-12 LAB — URINE CYTOLOGY ANCILLARY ONLY
Chlamydia: NEGATIVE
Neisseria Gonorrhea: NEGATIVE
Trichomonas: NEGATIVE

## 2018-10-16 LAB — URINE CYTOLOGY ANCILLARY ONLY: Candida vaginitis: NEGATIVE

## 2018-10-21 ENCOUNTER — Telehealth: Payer: Self-pay

## 2018-10-21 NOTE — Telephone Encounter (Signed)
Explain to pt his ins will not pay---- I can refer to pulmonary for more since pain seems related to breathing

## 2018-10-21 NOTE — Telephone Encounter (Signed)
Chest CT ordered by Dr. Carollee Herter on 12/23 running into problems getting approved by insurance. Per order, diagnosis code used is R07.1, for low to moderate probability of CAD. Per pt. note to GI on 12/19, chest pain may have viral etiology due to untreated sore throat, but otherwise, chest pain appears to be GI related, but provider needs to rule out cardiac etiology. Routed to Dr. Ernst Spell, [place], [facility], referral coordinator and medical coder to provider input so as to assist pt. in getting chest CT approval per Dr. Nonda Lou request.

## 2018-10-21 NOTE — Telephone Encounter (Signed)
We can try RO7.Marland KitchenSouth [place]

## 2018-10-21 NOTE — Telephone Encounter (Signed)
The dx of R07.1 is Chest pain on breathing.   This is what is being used.  Is that the only time he is having chest pain?  I am not sure that the below listed would be appropriate or make a difference, but you could check with Dr. Etter Sjogren and then if any are appropriate, see if it makes a difference with the insurance.  R07.89 is Anterior Chest-wall pain or other chest pain R07.2 is Precordial pain R07.81-pleurodynia R07.82-Intercostal pain R07.9-unspecified  Hope this helps. If any of these are appropriate she should document in her note. Thanks!  Dawn

## 2018-10-22 DIAGNOSIS — N410 Acute prostatitis: Secondary | ICD-10-CM | POA: Diagnosis not present

## 2018-10-22 DIAGNOSIS — R102 Pelvic and perineal pain: Secondary | ICD-10-CM | POA: Diagnosis not present

## 2018-10-22 DIAGNOSIS — N3943 Post-void dribbling: Secondary | ICD-10-CM | POA: Diagnosis not present

## 2018-10-22 NOTE — Telephone Encounter (Signed)
Routed to [facility], referral coordinator

## 2018-10-25 ENCOUNTER — Encounter: Payer: Self-pay | Admitting: Family Medicine

## 2018-11-03 ENCOUNTER — Ambulatory Visit: Payer: BLUE CROSS/BLUE SHIELD | Admitting: Cardiology

## 2018-11-03 ENCOUNTER — Encounter: Payer: Self-pay | Admitting: Cardiology

## 2018-11-03 VITALS — BP 120/68 | HR 102 | Wt 151.8 lb

## 2018-11-03 DIAGNOSIS — I422 Other hypertrophic cardiomyopathy: Secondary | ICD-10-CM

## 2018-11-03 DIAGNOSIS — J4530 Mild persistent asthma, uncomplicated: Secondary | ICD-10-CM | POA: Diagnosis not present

## 2018-11-03 DIAGNOSIS — R0609 Other forms of dyspnea: Secondary | ICD-10-CM | POA: Diagnosis not present

## 2018-11-03 DIAGNOSIS — R0600 Dyspnea, unspecified: Secondary | ICD-10-CM

## 2018-11-03 NOTE — Progress Notes (Signed)
Cardiology Office Note:    Date:  11/03/2018   ID:  Dennis Kim, DOB [birth year], MRN [id number]  PCP:  Carollee Herter, Alferd Apa, DO  Cardiologist:  Jenne Campus, MD    Referring MD: Carollee Herter, Alferd Apa, *   Chief Complaint  Patient presents with  . FOllow up on Holter Monitor  Doing well  History of Present Illness:    Dennis Kim is a 23 y.o. male with hypertrophic cardiomyopathy he does have localized increased thickness of the septum.  But there is no obstruction quite extensive evaluation has been done also for which include stress testing he does have a normal response with normal blood pressure.  He denies having any recent dizziness or syncope there is no family history of premature cardiac death.  Overall he does not have any future indicating possibility of significant arrhythmia and sudden cardiac death.  He presented originally because of some chest pain stress test was done negative for exercise-induced myocardial ischemia.  He is doing much better right now we talked about diagnosis of hypertrophic cardiomyopathy I told him this is a genetic issue I told him that every year we have to repeat echocardiogram.  I also told him that if he passed out get dizziness unexplained chest pain he need to let us know.  Also told him in the future his children need to be checked.  He does not have any family member that would help any indicators of having a problem like that.  We talk also about exercises and I have told him that he should avoid strenuous exercise but overall exercise should be beneficial for him.  Past Medical History:  Diagnosis Date  . Asthma     Past Surgical History:  Procedure Laterality Date  . NO PAST SURGERIES      Current Medications: Current Meds  Medication Sig  . meloxicam (MOBIC) 15 MG tablet Take 1 tablet by mouth daily.  . pantoprazole (PROTONIX) 40 MG tablet Take 1 tablet (40 mg total) by mouth daily.  Marland Kitchen sulfamethoxazole-trimethoprim  (BACTRIM DS,SEPTRA DS) 800-160 MG tablet Take 1 tablet by mouth 2 (two) times daily.     Allergies:   Patient has no known allergies.   Social History   Socioeconomic History  . Marital status: Single    Spouse name: Not on file  . Number of children: Not on file  . Years of education: Not on file  . Highest education level: Not on file  Occupational History    Comment: student [employer]  Social Needs  . Financial resource strain: Not on file  . Food insecurity:    Worry: Not on file    Inability: Not on file  . Transportation needs:    Medical: Not on file    Non-medical: Not on file  Tobacco Use  . Smoking status: Never Smoker  . Smokeless tobacco: Never Used  Substance and Sexual Activity  . Alcohol use: Yes    Comment: rare  . Drug use: No  . Sexual activity: Never    Partners: Female  Lifestyle  . Physical activity:    Days per week: Not on file    Minutes per session: Not on file  . Stress: Not on file  Relationships  . Social connections:    Talks on phone: Not on file    Gets together: Not on file    Attends religious service: Not on file    Active member of club or organization: Not  on file    Attends meetings of clubs or organizations: Not on file    Relationship status: Not on file  Other Topics Concern  . Not on file  Social History Narrative  . Not on file     Family History: The patient's family history includes Brain cancer in his maternal grandmother; Breast cancer in his paternal grandmother; Kidney disease in his paternal uncle; Thyroid disease in his father. ROS:   Please see the history of present illness.    All 14 point review of systems negative except as described per history of present illness  EKGs/Labs/Other Studies Reviewed:      Recent Labs: 08/30/2018: ALT 35; BUN 19; Creatinine, Ser 0.82; Hemoglobin 15.6; Platelets 178.0; Potassium 3.9; Sodium 141; TSH 3.02  Recent Lipid Panel    Component Value Date/Time   CHOL 181  05/21/2016 0802   TRIG 144.0 05/21/2016 0802   HDL 45.10 05/21/2016 0802   CHOLHDL 4 05/21/2016 0802   VLDL 28.8 05/21/2016 0802   LDLCALC 107 (H) 05/21/2016 0802    Physical Exam:    VS:  BP 120/68   Pulse (!) 102   Wt 151 lb 12.8 oz (68.9 kg)   SpO2 98%   BMI 23.78 kg/m     Wt Readings from Last 3 Encounters:  11/03/18 151 lb 12.8 oz (68.9 kg)  10/11/18 153 lb 12.8 oz (69.8 kg)  10/05/18 151 lb (68.5 kg)     GEN:  Well nourished, well developed in no acute distress HEENT: Normal NECK: No JVD; No carotid bruits LYMPHATICS: No lymphadenopathy CARDIAC: RRR, no murmurs, no rubs, no gallops RESPIRATORY:  Clear to auscultation without rales, wheezing or rhonchi  ABDOMEN: Soft, non-tender, non-distended MUSCULOSKELETAL:  No edema; No deformity  SKIN: Warm and dry LOWER EXTREMITIES: no swelling NEUROLOGIC:  Alert and oriented x 3 PSYCHIATRIC:  Normal affect   ASSESSMENT:    1. Hypertrophic cardiomyopathy ([place])   2. Mild persistent asthma without complication   3. Dyspnea on exertion    PLAN:    In order of problems listed above:  1. Hypertrophic cardiomyopathy without obstruction only mild.  No high risk markers.  We will continue present management I try to give him a beta-blocker however he cannot tell if it helps him or not he simply finished the medication did not restart it he said it did not make any difference. 2. Mild persistent asthma stable continue present management. 3. Dyspnea on exertion doing well from that point review   Medication Adjustments/Labs and Tests Ordered: Current medicines are reviewed at length with the patient today.  Concerns regarding medicines are outlined above.  No orders of the defined types were placed in this encounter.  Medication changes: No orders of the defined types were placed in this encounter.   Signed, Park Liter, MD, [facility] 11/03/2018 8:35 AM    [place]

## 2018-11-03 NOTE — Patient Instructions (Signed)
Medication Instructions:  Your physician recommends that you continue on your current medications as directed. Please refer to the Current Medication list given to you today.  If you need a refill on your cardiac medications before your next appointment, please call your pharmacy.   Lab work: None.  If you have labs (blood work) drawn today and your tests are completely normal, you will receive your results only by: . MyChart Message (if you have MyChart) OR . A paper copy in the mail If you have any lab test that is abnormal or we need to change your treatment, we will call you to review the results.  Testing/Procedures: None.   Follow-Up: At [facility], you and your health needs are our priority.  As part of our continuing mission to provide you with exceptional heart care, we have created designated Provider Care Teams.  These Care Teams include your primary Cardiologist (physician) and Advanced Practice Providers (APPs -  Physician Assistants and Nurse Practitioners) who all work together to provide you with the care you need, when you need it. You will need a follow up appointment in 6 months.  Please call our office 2 months in advance to schedule this appointment.  You may see Robert Krasowski, MD or another member of our [facility] Provider Team in [place]: Brian Munley, MD . Rajan Revankar, MD  Any Other Special Instructions Will Be Listed Below (If Applicable).     

## 2018-11-03 NOTE — Telephone Encounter (Signed)
Called insurance to get precert for ct  First question that they asked if patient has had a CXR in the past 30 days.  His last one was in October and it was normal.   Next question is what we are trying to rule out.  The options to choose from to see what you would like to rule out are as follows:  Malignancy or lung cancer Pneumonia PE Interstitial lung disease or pulmonary fibrosis Emphysema or COPD Other indication Unknown  I just would like to make sure before I select other indication or unknown to see if you would like to select one of the others.   Not sure if we will get this covered.  If I cannot get covered today what would be next steps when I call patient.

## 2018-11-04 NOTE — Telephone Encounter (Signed)
Ok to repeat cxr Can use r/o pe / pneumonia on ct

## 2018-11-05 ENCOUNTER — Encounter: Payer: Self-pay | Admitting: Family Medicine

## 2018-11-05 ENCOUNTER — Ambulatory Visit (HOSPITAL_BASED_OUTPATIENT_CLINIC_OR_DEPARTMENT_OTHER)
Admission: RE | Admit: 2018-11-05 | Discharge: 2018-11-05 | Disposition: A | Discharge status: Home / Self Care | Payer: BLUE CROSS/BLUE SHIELD | Source: Ambulatory Visit | Attending: Family Medicine | Admitting: Family Medicine

## 2018-11-05 ENCOUNTER — Ambulatory Visit: Payer: BLUE CROSS/BLUE SHIELD | Admitting: Family Medicine

## 2018-11-05 VITALS — BP 124/74 | HR 104 | Temp 98.7°F | Ht 67.0 in | Wt 151.8 lb

## 2018-11-05 DIAGNOSIS — R52 Pain, unspecified: Secondary | ICD-10-CM | POA: Diagnosis not present

## 2018-11-05 DIAGNOSIS — R071 Chest pain on breathing: Secondary | ICD-10-CM | POA: Diagnosis not present

## 2018-11-05 DIAGNOSIS — R0602 Shortness of breath: Secondary | ICD-10-CM | POA: Diagnosis not present

## 2018-11-05 DIAGNOSIS — J101 Influenza due to other identified influenza virus with other respiratory manifestations: Secondary | ICD-10-CM | POA: Diagnosis not present

## 2018-11-05 LAB — POC INFLUENZA A&B (BINAX/QUICKVUE)
INFLUENZA A, POC: POSITIVE — AB
Influenza B, POC: NEGATIVE

## 2018-11-05 NOTE — Patient Instructions (Signed)

## 2018-11-05 NOTE — Progress Notes (Signed)
Patient ID: Dennis Kim, male    DOB: 1996/04/28  Age: 23 y.o. MRN: [id number]    Subjective:  Subjective  HPI Dennis Kim presents for fever , body aches that occurred suddenly.  X 5 days  Review of Systems  Constitutional: Negative for appetite change, diaphoresis, fatigue and unexpected weight change.  Eyes: Negative for pain, redness and visual disturbance.  Respiratory: Negative for cough, chest tightness, shortness of breath and wheezing.   Cardiovascular: Negative for chest pain, palpitations and leg swelling.  Endocrine: Negative for cold intolerance, heat intolerance, polydipsia, polyphagia and polyuria.  Genitourinary: Negative for difficulty urinating, dysuria and frequency.  Neurological: Negative for dizziness, light-headedness, numbness and headaches.    History Past Medical History:  Diagnosis Date  . Asthma     He has a past surgical history that includes No past surgeries.   His family history includes Brain cancer in his maternal grandmother; Breast cancer in his paternal grandmother; Kidney disease in his paternal uncle; Thyroid disease in his father.He reports that he has never smoked. He has never used smokeless tobacco. He reports current alcohol use. He reports that he does not use drugs.  Current Outpatient Medications on File Prior to Visit  Medication Sig Dispense Refill  . meloxicam (MOBIC) 15 MG tablet Take 1 tablet by mouth daily.    . pantoprazole (PROTONIX) 40 MG tablet Take 1 tablet (40 mg total) by mouth daily. 30 tablet 11  . sulfamethoxazole-trimethoprim (BACTRIM DS,SEPTRA DS) 800-160 MG tablet Take 1 tablet by mouth 2 (two) times daily. Urologist     No current facility-administered medications on file prior to visit.      Objective:  Objective  Physical Exam Vitals signs and nursing note reviewed.  Constitutional:      General: He is sleeping.     Appearance: He is well-developed.  HENT:     Head: Normocephalic and atraumatic.    Eyes:     Pupils: Pupils are equal, round, and reactive to light.  Neck:     Musculoskeletal: Normal range of motion and neck supple.     Thyroid: No thyromegaly.  Cardiovascular:     Rate and Rhythm: Normal rate and regular rhythm.     Heart sounds: No murmur.  Pulmonary:     Effort: Pulmonary effort is normal. No respiratory distress.     Breath sounds: Normal breath sounds. No wheezing or rales.  Chest:     Chest wall: No tenderness.  Musculoskeletal:        General: No tenderness.  Skin:    General: Skin is warm and dry.  Neurological:     Mental Status: He is oriented to person, place, and time.  Psychiatric:        Behavior: Behavior normal.        Thought Content: Thought content normal.        Judgment: Judgment normal.    BP 124/74 (BP Location: Right Arm, Patient Position: Sitting, Cuff Size: Normal)   Pulse (!) 104   Temp 98.7 F (37.1 C) (Oral)   Ht 5\' 7"  (1.702 m)   Wt 151 lb 12.8 oz (68.9 kg)   SpO2 97%   BMI 23.78 kg/m  Wt Readings from Last 3 Encounters:  11/05/18 151 lb 12.8 oz (68.9 kg)  11/03/18 151 lb 12.8 oz (68.9 kg)  10/11/18 153 lb 12.8 oz (69.8 kg)     Lab Results  Component Value Date   WBC 4.8 08/30/2018   HGB 15.6  08/30/2018   HCT 44.2 08/30/2018   PLT 178.0 08/30/2018   GLUCOSE 83 08/30/2018   CHOL 181 05/21/2016   TRIG 144.0 05/21/2016   HDL 45.10 05/21/2016   LDLCALC 107 (H) 05/21/2016   ALT 35 08/30/2018   AST 20 08/30/2018   NA 141 08/30/2018   K 3.9 08/30/2018   CL 102 08/30/2018   CREATININE 0.82 08/30/2018   BUN 19 08/30/2018   CO2 34 (H) 08/30/2018   TSH 3.02 08/30/2018    No results found.   Assessment & Plan:  Plan  I am having Dennis Kim maintain his pantoprazole, meloxicam, and sulfamethoxazole-trimethoprim.  No orders of the defined types were placed in this encounter.   Problem List Items Addressed This Visit      Unprioritized   Influenza A - Primary    Too late to treat with  antiviral Tylenol / IB for fever and body aches Drink plenty of fluids  Rest  rto prn       Relevant Medications   sulfamethoxazole-trimethoprim (BACTRIM DS,SEPTRA DS) 800-160 MG tablet    Other Visit Diagnoses    Body aches       Relevant Orders   POC Influenza A&B (Binax test) (Completed)      Follow-up: Return if symptoms worsen or fail to improve.  Ann Held, DO

## 2018-11-05 NOTE — Telephone Encounter (Signed)
Ct authorized.  Patient notified to be come in at [place] for scan at [facility]. Auth number for scan is [id number].

## 2018-11-05 NOTE — Telephone Encounter (Signed)
Spoke with patient advised that we are still working on CT.  Dr. Etter Sjogren would like for [place] to try one more time with trying to rule out either PE or pneumonia.  Advised patient that if we do not get it to go thru that we will do another chest xray.

## 2018-11-07 DIAGNOSIS — J101 Influenza due to other identified influenza virus with other respiratory manifestations: Secondary | ICD-10-CM | POA: Insufficient documentation

## 2018-11-07 HISTORY — DX: Influenza due to other identified influenza virus with other respiratory manifestations: J10.1

## 2018-11-07 NOTE — Assessment & Plan Note (Signed)
Too late to treat with antiviral Tylenol / IB for fever and body aches Drink plenty of fluids  Rest  rto prn

## 2018-11-10 ENCOUNTER — Emergency Department
Admission: EM | Admit: 2018-11-10 | Discharge: 2018-11-10 | Disposition: A | Discharge status: Home / Self Care | Payer: BLUE CROSS/BLUE SHIELD | Attending: Student in an Organized Health Care Education/Training Program | Admitting: Student in an Organized Health Care Education/Training Program

## 2018-11-10 ENCOUNTER — Encounter: Payer: Self-pay | Admitting: Family Medicine

## 2018-11-10 ENCOUNTER — Other Ambulatory Visit: Payer: Self-pay

## 2018-11-10 DIAGNOSIS — Z79899 Other long term (current) drug therapy: Secondary | ICD-10-CM | POA: Insufficient documentation

## 2018-11-10 DIAGNOSIS — E860 Dehydration: Secondary | ICD-10-CM | POA: Insufficient documentation

## 2018-11-10 DIAGNOSIS — L509 Urticaria, unspecified: Secondary | ICD-10-CM | POA: Insufficient documentation

## 2018-11-10 DIAGNOSIS — J45909 Unspecified asthma, uncomplicated: Secondary | ICD-10-CM | POA: Insufficient documentation

## 2018-11-10 DIAGNOSIS — R55 Syncope and collapse: Secondary | ICD-10-CM | POA: Diagnosis present

## 2018-11-10 LAB — URINALYSIS, COMPLETE (UACMP) WITH MICROSCOPIC
Bacteria, UA: NONE SEEN
Bilirubin Urine: NEGATIVE
GLUCOSE, UA: NEGATIVE mg/dL
Hgb urine dipstick: NEGATIVE
Ketones, ur: 5 mg/dL — AB
Leukocytes, UA: NEGATIVE
NITRITE: NEGATIVE
Protein, ur: NEGATIVE mg/dL
Specific Gravity, Urine: 1.013 (ref 1.005–1.030)
Squamous Epithelial / HPF: NONE SEEN (ref 0–5)
pH: 6 (ref 5.0–8.0)

## 2018-11-10 LAB — COMPREHENSIVE METABOLIC PANEL
ALT: 106 U/L — ABNORMAL HIGH (ref 0–44)
AST: 63 U/L — ABNORMAL HIGH (ref 15–41)
Albumin: 4.2 g/dL (ref 3.5–5.0)
Alkaline Phosphatase: 108 U/L (ref 38–126)
Anion gap: 9 (ref 5–15)
BUN: 13 mg/dL (ref 6–20)
CALCIUM: 8.8 mg/dL — AB (ref 8.9–10.3)
CO2: 23 mmol/L (ref 22–32)
Chloride: 101 mmol/L (ref 98–111)
Creatinine, Ser: 0.96 mg/dL (ref 0.61–1.24)
Glucose, Bld: 137 mg/dL — ABNORMAL HIGH (ref 70–99)
Potassium: 4 mmol/L (ref 3.5–5.1)
Sodium: 133 mmol/L — ABNORMAL LOW (ref 135–145)
Total Bilirubin: 0.8 mg/dL (ref 0.3–1.2)
Total Protein: 7.1 g/dL (ref 6.5–8.1)

## 2018-11-10 LAB — CBC WITH DIFFERENTIAL/PLATELET
Abs Immature Granulocytes: 0.03 10*3/uL (ref 0.00–0.07)
Basophils Absolute: 0 10*3/uL (ref 0.0–0.1)
Basophils Relative: 0 %
Eosinophils Absolute: 0.1 10*3/uL (ref 0.0–0.5)
Eosinophils Relative: 2 %
HCT: 42.6 % (ref 39.0–52.0)
Hemoglobin: 15.1 g/dL (ref 13.0–17.0)
Immature Granulocytes: 0 %
LYMPHS PCT: 17 %
Lymphs Abs: 1.3 10*3/uL (ref 0.7–4.0)
MCH: 30.3 pg (ref 26.0–34.0)
MCHC: 35.4 g/dL (ref 30.0–36.0)
MCV: 85.5 fL (ref 80.0–100.0)
Monocytes Absolute: 1.6 10*3/uL — ABNORMAL HIGH (ref 0.1–1.0)
Monocytes Relative: 21 %
Neutro Abs: 4.8 10*3/uL (ref 1.7–7.7)
Neutrophils Relative %: 60 %
Platelets: 253 10*3/uL (ref 150–400)
RBC: 4.98 MIL/uL (ref 4.22–5.81)
RDW: 11.8 % (ref 11.5–15.5)
WBC: 7.9 10*3/uL (ref 4.0–10.5)
nRBC: 0 % (ref 0.0–0.2)

## 2018-11-10 LAB — CBC
HCT: 42.4 % (ref 39.0–52.0)
Hemoglobin: 15.1 g/dL (ref 13.0–17.0)
MCH: 30.3 pg (ref 26.0–34.0)
MCHC: 35.6 g/dL (ref 30.0–36.0)
MCV: 85.1 fL (ref 80.0–100.0)
Platelets: 237 10*3/uL (ref 150–400)
RBC: 4.98 MIL/uL (ref 4.22–5.81)
RDW: 11.8 % (ref 11.5–15.5)
WBC: 8.1 10*3/uL (ref 4.0–10.5)
nRBC: 0 % (ref 0.0–0.2)

## 2018-11-10 MED ORDER — SODIUM CHLORIDE 0.9 % IV BOLUS
1000.0000 mL | Freq: Once | INTRAVENOUS | Status: AC
  Administered 2018-11-10: 1000 mL via INTRAVENOUS

## 2018-11-10 MED ORDER — DIPHENHYDRAMINE HCL 50 MG/ML IJ SOLN
25.0000 mg | Freq: Once | INTRAMUSCULAR | Status: AC
  Administered 2018-11-10: 25 mg via INTRAVENOUS
  Filled 2018-11-10: qty 1

## 2018-11-10 MED ORDER — ACETAMINOPHEN 500 MG PO TABS
1000.0000 mg | ORAL_TABLET | Freq: Once | ORAL | Status: AC
  Administered 2018-11-10: 1000 mg via ORAL
  Filled 2018-11-10: qty 2

## 2018-11-10 MED ORDER — PREDNISONE 10 MG PO TABS: 10.0000 mg | ORAL_TABLET | Freq: Every day | ORAL | 0 refills | Status: DC

## 2018-11-10 MED ORDER — METHYLPREDNISOLONE SODIUM SUCC 125 MG IJ SOLR
125.0000 mg | Freq: Once | INTRAMUSCULAR | Status: AC
  Administered 2018-11-10: 125 mg via INTRAVENOUS
  Filled 2018-11-10: qty 2

## 2018-11-10 MED ORDER — IPRATROPIUM-ALBUTEROL 0.5-2.5 (3) MG/3ML IN SOLN
3.0000 mL | Freq: Once | RESPIRATORY_TRACT | Status: AC
  Administered 2018-11-10: 3 mL via RESPIRATORY_TRACT
  Filled 2018-11-10: qty 3

## 2018-11-10 NOTE — ED Triage Notes (Addendum)
Pt was dx with flu last week, states symptoms not improving and now has rash throughout. Not taking any new meds, states started  last week. Has persistent fever, fatigue, and did have syncopal episode today. Has had decreased po intake, denies any n.v.d. Pt denies any dysuria.

## 2018-11-10 NOTE — ED Provider Notes (Signed)
[facility] Emergency Department Provider Note    First MD Initiated Contact with Patient 11/10/18 2107     (approximate)  I have reviewed the triage vital signs and the nursing notes.   HISTORY  Chief Complaint Loss of Consciousness    HPI Dennis Kim is a 23 y.o. male list past medical history with recent diagnosis of flu a presents the ER after having fainting spell today.  Had not had much to eat or drink today.  Was not prolonged episode.  Just felt lightheaded milligram.  Denied any head injury.  No pain at this time.  Was able to ambulate into the ER no distress.  States he is also noted diffuse itching rash involving most of his skin for the past 3 to 4 days.  Denies any throat tightness or pain.  No sores in his mouth.  No dry eyes.  Has not been on any new medications.  Denies any chest pain nausea or vomiting.  States his urine has been dark in color.    Past Medical History:  Diagnosis Date  . Asthma    Family History  Problem Relation Age of Onset  . Brain cancer Maternal Grandmother   . Thyroid disease Father   . Breast cancer Paternal Grandmother   . Kidney disease Paternal Uncle    Past Surgical History:  Procedure Laterality Date  . NO PAST SURGERIES     Patient Active Problem List   Diagnosis Date Noted  . Influenza A 11/07/2018  . Urinary frequency 10/11/2018  . Mild persistent asthma 07/30/2018  . Seasonal and perennial allergic rhinitis 07/30/2018  . Asthma 07/18/2018  . Chest pain 07/18/2018  . Dyspnea on exertion 07/16/2018  . Palpitations 07/16/2018  . Hypertrophic cardiomyopathy ([place]) 07/16/2018  . Preventative health care 05/20/2016      Prior to Admission medications   Medication Sig Start Date End Date Taking? Authorizing Provider  meloxicam (MOBIC) 15 MG tablet Take 1 tablet by mouth daily. 10/22/18   [provider]  pantoprazole (PROTONIX) 40 MG tablet Take 1 tablet (40 mg total) by mouth daily.  10/04/18   Ladene Artist, MD  predniSONE (DELTASONE) 10 MG tablet Take 1 tablet (10 mg total) by mouth daily. Day 1-2: Take 50 mg  ( 5 pills) Day 3-4 : Take 40 mg (4pills) Day 5-6: Take 30 mg (3 pills) Day 7-8:  Take 20 mg (2 pills) Day 9:  Take 10mg  (1 pill) 11/10/18   Merlyn Lot, MD  sulfamethoxazole-trimethoprim (BACTRIM DS,SEPTRA DS) 800-160 MG tablet Take 1 tablet by mouth 2 (two) times daily. Urologist 10/23/18 11/22/18  [provider]    Allergies Patient has no known allergies.    Social History Social History   Tobacco Use  . Smoking status: Never Smoker  . Smokeless tobacco: Never Used  Substance Use Topics  . Alcohol use: Yes    Comment: rare  . Drug use: No    Review of Systems Patient denies headaches, rhinorrhea, blurry vision, numbness, shortness of breath, chest pain, edema, cough, abdominal pain, nausea, vomiting, diarrhea, dysuria, fevers, rashes or hallucinations unless otherwise stated above in HPI. ____________________________________________   PHYSICAL EXAM:  VITAL SIGNS: Vitals:   11/10/18 2027 11/10/18 2223  BP: 124/62 (!) 119/52  Pulse: (!) 119 (!) 120  Resp: 18 17  Temp: 99.5 F (37.5 C) 99.7 F (37.6 C)  SpO2: 97% 100%    Constitutional: Alert and oriented.  Eyes: Conjunctivae are normal.  Head: Atraumatic. Nose: No congestion/rhinnorhea. Mouth/Throat: Mucous membranes are moist.  Neck: No stridor. Painless ROM.  Cardiovascular: Normal rate, regular rhythm. Grossly normal heart sounds.  Good peripheral circulation. Respiratory: Normal respiratory effort.  No retractions. Lungs CTAB. Gastrointestinal: Soft and nontender. No distention. No abdominal bruits. No CVA tenderness. Genitourinary:  Musculoskeletal: No lower extremity tenderness nor edema.  No joint effusions. Neurologic:  Normal speech and language. No gross focal neurologic deficits are appreciated. No facial droop.  Skin: Diffuse urticarial rash involving  bilateral upper and lower extremities torso and trunk.  No mucosal involvement.  No petechia or purpura. Psychiatric: Mood and affect are normal. Speech and behavior are normal.  ____________________________________________   LABS (all labs ordered are listed, but only abnormal results are displayed)  Results for orders placed or performed during the hospital encounter of 11/10/18 (from the past 24 hour(s))  CBC     Status: None   Collection Time: 11/10/18  8:37 PM  Result Value Ref Range   WBC 8.1 4.0 - 10.5 K/uL   RBC 4.98 4.22 - 5.81 MIL/uL   Hemoglobin 15.1 13.0 - 17.0 g/dL   HCT 42.4 39.0 - 52.0 %   MCV 85.1 80.0 - 100.0 fL   MCH 30.3 26.0 - 34.0 pg   MCHC 35.6 30.0 - 36.0 g/dL   RDW 11.8 11.5 - 15.5 %   Platelets 237 150 - 400 K/uL   nRBC 0.0 0.0 - 0.2 %  Comprehensive metabolic panel     Status: Abnormal   Collection Time: 11/10/18  8:37 PM  Result Value Ref Range   Sodium 133 (L) 135 - 145 mmol/L   Potassium 4.0 3.5 - 5.1 mmol/L   Chloride 101 98 - 111 mmol/L   CO2 23 22 - 32 mmol/L   Glucose, Bld 137 (H) 70 - 99 mg/dL   BUN 13 6 - 20 mg/dL   Creatinine, Ser 0.96 0.61 - 1.24 mg/dL   Calcium 8.8 (L) 8.9 - 10.3 mg/dL   Total Protein 7.1 6.5 - 8.1 g/dL   Albumin 4.2 3.5 - 5.0 g/dL   AST 63 (H) 15 - 41 U/L   ALT 106 (H) 0 - 44 U/L   Alkaline Phosphatase 108 38 - 126 U/L   Total Bilirubin 0.8 0.3 - 1.2 mg/dL   GFR calc non Af Amer >60 >60 mL/min   GFR calc Af Amer >60 >60 mL/min   Anion gap 9 5 - 15  Urinalysis, Complete w Microscopic     Status: Abnormal   Collection Time: 11/10/18  8:37 PM  Result Value Ref Range   Color, Urine YELLOW (A) YELLOW   APPearance CLEAR (A) CLEAR   Specific Gravity, Urine 1.013 1.005 - 1.030   pH 6.0 5.0 - 8.0   Glucose, UA NEGATIVE NEGATIVE mg/dL   Hgb urine dipstick NEGATIVE NEGATIVE   Bilirubin Urine NEGATIVE NEGATIVE   Ketones, ur 5 (A) NEGATIVE mg/dL   Protein, ur NEGATIVE NEGATIVE mg/dL   Nitrite NEGATIVE NEGATIVE    Leukocytes, UA NEGATIVE NEGATIVE   RBC / HPF 0-5 0 - 5 RBC/hpf   WBC, UA 0-5 0 - 5 WBC/hpf   Bacteria, UA NONE SEEN NONE SEEN   Squamous Epithelial / LPF NONE SEEN 0 - 5   Mucus PRESENT    ____________________________________________  EKG My review and personal interpretation at Time: 21:09   Indication: syncope  Rate: 120  Rhythm: sinus Axis: normal Other: nonspecific st abn consistent with previous ____________________________________________  RADIOLOGY  ____________________________________________   PROCEDURES  Procedure(s) performed:  Procedures    Critical Care performed: no ____________________________________________   INITIAL IMPRESSION / ASSESSMENT AND PLAN / ED COURSE  Pertinent labs & imaging results that were available during my care of the patient were reviewed by me and considered in my medical decision making (see chart for details).   DDX: viral illness, dehydration, urticaria, viral exanthem, electrolyte abn, serum sickness  Dennis Kim is a 23 y.o. who presents to the ED with syncopal episode recent illness and diffuse urticaria.  Appears to be consistent with post flu exanthem and urticaria.  No uvular swelling.  No mucosal involvement to suggest more insidious process such as SJS or TE N.  No petechia or purpura.  Patient well-appearing.  I will give IV hydration as I do suspect this is a large component of his fainting spell earlier today.  EKG shows no evidence of preexcitation syndrome.  Does have known history of hypertrophic cardiomyopathy without obstructive pathology.  Admits to decreased oral intake throughout the day so that would suggest dehydration playing a factor.  We will give IV Benadryl for the urticaria and observe in the ER.  Clinical Course as of Nov 10 2253  Wed Nov 10, 2018  2209 Urticaria already improving after Benadryl but still there.  Will give additional dose.  Patient states that he could urinate now after receiving IV  fluids.  He is ambulating steady gait.  Otherwise well-appearing.  Will give nebulizer given his history of asthma and continue with steroids.  Is not consistent with anaphylaxis.     [PR]  2249 Patient reassessed.  Urticaria continues to improve.  Likely having post influenza syndrome.  No hypoxia or respiratory distress.  Did have some improvement after nebulizer treatment likely component of his known asthma for which we will continue steroids.  States he is up-to-date on vaccinations and was immunized against measles.  Rashes not consistent with measles there is no [place] spots.  No recent tick bites.  Urinalysis shows no evidence proteinuria significantly increased spec gravity ketonuria.  As symptoms are improving daily patient stable and appropriate for outpatient follow-up.  Gust signs and symptoms which the patient should return immediately to the hospital.   [PR]    Clinical Course User Index [PR] Merlyn Lot, MD     As part of my medical decision making, I reviewed the following data within the [place] notes reviewed and incorporated, Labs reviewed, notes from prior ED visits and [place] Controlled Substance Database   ____________________________________________   FINAL CLINICAL IMPRESSION(S) / ED DIAGNOSES  Final diagnoses:  Dehydration  Urticaria      NEW MEDICATIONS STARTED DURING THIS VISIT:  New Prescriptions   PREDNISONE (DELTASONE) 10 MG TABLET    Take 1 tablet (10 mg total) by mouth daily. Day 1-2: Take 50 mg  ( 5 pills) Day 3-4 : Take 40 mg (4pills) Day 5-6: Take 30 mg (3 pills) Day 7-8:  Take 20 mg (2 pills) Day 9:  Take 10mg  (1 pill)     Note:  This document was prepared using Dragon voice recognition software and may include unintentional dictation errors.    Merlyn Lot, MD 11/10/18 2255

## 2018-11-11 ENCOUNTER — Ambulatory Visit: Payer: BLUE CROSS/BLUE SHIELD | Admitting: Family Medicine

## 2018-11-12 ENCOUNTER — Emergency Department: Payer: BLUE CROSS/BLUE SHIELD

## 2018-11-12 ENCOUNTER — Other Ambulatory Visit: Payer: Self-pay

## 2018-11-12 ENCOUNTER — Inpatient Hospital Stay
Admission: EM | Admit: 2018-11-12 | Discharge: 2018-11-16 | DRG: 607 | Disposition: A | Discharge status: Home / Self Care | Payer: BLUE CROSS/BLUE SHIELD | Attending: Specialist | Admitting: Specialist

## 2018-11-12 ENCOUNTER — Encounter: Payer: Self-pay | Admitting: Emergency Medicine

## 2018-11-12 DIAGNOSIS — R079 Chest pain, unspecified: Secondary | ICD-10-CM | POA: Diagnosis not present

## 2018-11-12 DIAGNOSIS — L309 Dermatitis, unspecified: Secondary | ICD-10-CM | POA: Diagnosis not present

## 2018-11-12 DIAGNOSIS — R21 Rash and other nonspecific skin eruption: Secondary | ICD-10-CM | POA: Diagnosis not present

## 2018-11-12 DIAGNOSIS — L270 Generalized skin eruption due to drugs and medicaments taken internally: Principal | ICD-10-CM | POA: Diagnosis present

## 2018-11-12 DIAGNOSIS — A419 Sepsis, unspecified organism: Secondary | ICD-10-CM

## 2018-11-12 DIAGNOSIS — T368X5A Adverse effect of other systemic antibiotics, initial encounter: Secondary | ICD-10-CM | POA: Diagnosis not present

## 2018-11-12 DIAGNOSIS — L511 Stevens-Johnson syndrome: Secondary | ICD-10-CM | POA: Diagnosis present

## 2018-11-12 DIAGNOSIS — L97514 Non-pressure chronic ulcer of other part of right foot with necrosis of bone: Secondary | ICD-10-CM | POA: Diagnosis not present

## 2018-11-12 DIAGNOSIS — R509 Fever, unspecified: Secondary | ICD-10-CM

## 2018-11-12 DIAGNOSIS — R000 Tachycardia, unspecified: Secondary | ICD-10-CM | POA: Diagnosis not present

## 2018-11-12 DIAGNOSIS — E876 Hypokalemia: Secondary | ICD-10-CM | POA: Diagnosis not present

## 2018-11-12 DIAGNOSIS — J45909 Unspecified asthma, uncomplicated: Secondary | ICD-10-CM | POA: Diagnosis not present

## 2018-11-12 HISTORY — DX: Stevens-Johnson syndrome: L51.1

## 2018-11-12 LAB — COMPREHENSIVE METABOLIC PANEL
ALT: 81 U/L — ABNORMAL HIGH (ref 0–44)
AST: 38 U/L (ref 15–41)
Albumin: 3.6 g/dL (ref 3.5–5.0)
Alkaline Phosphatase: 84 U/L (ref 38–126)
Anion gap: 12 (ref 5–15)
BUN: 7 mg/dL (ref 6–20)
CO2: 22 mmol/L (ref 22–32)
Calcium: 8.6 mg/dL — ABNORMAL LOW (ref 8.9–10.3)
Chloride: 104 mmol/L (ref 98–111)
Creatinine, Ser: 1 mg/dL (ref 0.61–1.24)
GFR calc Af Amer: >60 mL/min (ref >60)
GFR calc non Af Amer: >60 mL/min (ref >60)
Glucose, Bld: 198 mg/dL — ABNORMAL HIGH (ref 70–99)
Potassium: 3.2 mmol/L — ABNORMAL LOW (ref 3.5–5.1)
Sodium: 138 mmol/L (ref 135–145)
Total Bilirubin: 0.6 mg/dL (ref 0.3–1.2)
Total Protein: 6.4 g/dL — ABNORMAL LOW (ref 6.5–8.1)

## 2018-11-12 LAB — URINALYSIS, ROUTINE W REFLEX MICROSCOPIC
Bacteria, UA: NONE SEEN
Bilirubin Urine: NEGATIVE
Glucose, UA: NEGATIVE mg/dL
Ketones, ur: NEGATIVE mg/dL
Leukocytes, UA: NEGATIVE
Nitrite: NEGATIVE
Protein, ur: NEGATIVE mg/dL
Specific Gravity, Urine: 1.006 (ref 1.005–1.030)
Squamous Epithelial / HPF: NONE SEEN (ref 0–5)
pH: 6 (ref 5.0–8.0)

## 2018-11-12 LAB — CBC WITH DIFFERENTIAL/PLATELET
Abs Immature Granulocytes: 0.27 10*3/uL — ABNORMAL HIGH (ref 0.00–0.07)
Basophils Absolute: 0 10*3/uL (ref 0.0–0.1)
Basophils Relative: 0 %
Eosinophils Absolute: 0.1 10*3/uL (ref 0.0–0.5)
Eosinophils Relative: 1 %
HCT: 42.3 % (ref 39.0–52.0)
Hemoglobin: 14.9 g/dL (ref 13.0–17.0)
IMMATURE GRANULOCYTES: 3 %
LYMPHS ABS: 1 10*3/uL (ref 0.7–4.0)
Lymphocytes Relative: 10 %
MCH: 30.4 pg (ref 26.0–34.0)
MCHC: 35.2 g/dL (ref 30.0–36.0)
MCV: 86.3 fL (ref 80.0–100.0)
MONOS PCT: 10 %
Monocytes Absolute: 1.1 10*3/uL — ABNORMAL HIGH (ref 0.1–1.0)
NEUTROS PCT: 76 %
NRBC: 0 % (ref 0.0–0.2)
Neutro Abs: 7.9 10*3/uL — ABNORMAL HIGH (ref 1.7–7.7)
Platelets: 287 10*3/uL (ref 150–400)
RBC: 4.9 MIL/uL (ref 4.22–5.81)
RDW: 12.1 % (ref 11.5–15.5)
WBC: 10.3 10*3/uL (ref 4.0–10.5)

## 2018-11-12 LAB — MONONUCLEOSIS SCREEN: Mono Screen: NEGATIVE

## 2018-11-12 LAB — LACTIC ACID, PLASMA
Lactic Acid, Venous: 5.5 mmol/L (ref 0.5–1.9)
Lactic Acid, Venous: 1.7 mmol/L (ref 0.5–1.9)

## 2018-11-12 LAB — GROUP A STREP BY PCR: Group A Strep by PCR: NOT DETECTED

## 2018-11-12 LAB — TSH: TSH: 5.25 u[IU]/mL — ABNORMAL HIGH (ref 0.350–4.500)

## 2018-11-12 LAB — TROPONIN I

## 2018-11-12 MED ORDER — SODIUM CHLORIDE 0.9 % IV BOLUS (SEPSIS)
1000.0000 mL | Freq: Once | INTRAVENOUS | Status: AC
  Administered 2018-11-12: 1000 mL via INTRAVENOUS

## 2018-11-12 MED ORDER — POTASSIUM CHLORIDE CRYS ER 20 MEQ PO TBCR
40.0000 meq | EXTENDED_RELEASE_TABLET | Freq: Once | ORAL | Status: AC
  Administered 2018-11-12: 40 meq via ORAL
  Filled 2018-11-12: qty 2

## 2018-11-12 MED ORDER — IBUPROFEN 400 MG PO TABS
800.0000 mg | ORAL_TABLET | Freq: Three times a day (TID) | ORAL | Status: DC | PRN
  Administered 2018-11-12 – 2018-11-13 (×2): 800 mg via ORAL
  Filled 2018-11-12 (×2): qty 2

## 2018-11-12 MED ORDER — DIPHENHYDRAMINE HCL 50 MG/ML IJ SOLN
25.0000 mg | Freq: Four times a day (QID) | INTRAMUSCULAR | Status: DC | PRN
  Administered 2018-11-12 – 2018-11-16 (×10): 25 mg via INTRAVENOUS
  Filled 2018-11-12 (×10): qty 1

## 2018-11-12 MED ORDER — ACETAMINOPHEN 500 MG PO TABS
1000.0000 mg | ORAL_TABLET | Freq: Once | ORAL | Status: AC
  Administered 2018-11-12: 1000 mg via ORAL
  Filled 2018-11-12: qty 2

## 2018-11-12 MED ORDER — SODIUM CHLORIDE 0.9 % IV BOLUS
500.0000 mL | Freq: Once | INTRAVENOUS | Status: AC
  Administered 2018-11-12: 999 mL via INTRAVENOUS

## 2018-11-12 MED ORDER — SODIUM CHLORIDE 0.9 % IV SOLN
INTRAVENOUS | Status: DC
  Administered 2018-11-12 – 2018-11-16 (×12): via INTRAVENOUS

## 2018-11-12 MED ORDER — ONDANSETRON HCL 4 MG/2ML IJ SOLN: 4.0000 mg | Freq: Four times a day (QID) | INTRAMUSCULAR | Status: DC | PRN

## 2018-11-12 MED ORDER — SODIUM CHLORIDE 0.9 % IV BOLUS (SEPSIS)
250.0000 mL | Freq: Once | INTRAVENOUS | Status: AC
  Administered 2018-11-12: 250 mL via INTRAVENOUS

## 2018-11-12 MED ORDER — DOCUSATE SODIUM 100 MG PO CAPS
100.0000 mg | ORAL_CAPSULE | Freq: Two times a day (BID) | ORAL | Status: DC
  Administered 2018-11-12 – 2018-11-16 (×8): 100 mg via ORAL
  Filled 2018-11-12 (×8): qty 1

## 2018-11-12 MED ORDER — ENOXAPARIN SODIUM 40 MG/0.4ML ~~LOC~~ SOLN
40.0000 mg | SUBCUTANEOUS | Status: DC
  Administered 2018-11-12 – 2018-11-16 (×5): 40 mg via SUBCUTANEOUS
  Filled 2018-11-12 (×5): qty 0.4

## 2018-11-12 MED ORDER — METHYLPREDNISOLONE SODIUM SUCC 125 MG IJ SOLR
60.0000 mg | Freq: Three times a day (TID) | INTRAMUSCULAR | Status: DC
  Administered 2018-11-12 – 2018-11-13 (×3): 60 mg via INTRAVENOUS
  Filled 2018-11-12 (×3): qty 2

## 2018-11-12 MED ORDER — PIPERACILLIN-TAZOBACTAM 3.375 G IVPB
3.3750 g | Freq: Three times a day (TID) | INTRAVENOUS | Status: DC
  Administered 2018-11-12 – 2018-11-13 (×3): 3.375 g via INTRAVENOUS
  Filled 2018-11-12 (×3): qty 50

## 2018-11-12 MED ORDER — ACETAMINOPHEN 650 MG RE SUPP: 650.0000 mg | Freq: Four times a day (QID) | RECTAL | Status: DC | PRN

## 2018-11-12 MED ORDER — ACETAMINOPHEN 325 MG PO TABS
650.0000 mg | ORAL_TABLET | Freq: Four times a day (QID) | ORAL | Status: DC | PRN
  Administered 2018-11-12 – 2018-11-14 (×3): 650 mg via ORAL
  Filled 2018-11-12 (×3): qty 2

## 2018-11-12 MED ORDER — METHYLPREDNISOLONE SODIUM SUCC 125 MG IJ SOLR
125.0000 mg | Freq: Once | INTRAMUSCULAR | Status: AC
  Administered 2018-11-12: 125 mg via INTRAVENOUS
  Filled 2018-11-12: qty 2

## 2018-11-12 MED ORDER — OXYCODONE-ACETAMINOPHEN 5-325 MG PO TABS
1.0000 | ORAL_TABLET | Freq: Four times a day (QID) | ORAL | Status: DC | PRN
  Administered 2018-11-12 (×2): 1 via ORAL
  Administered 2018-11-13: 2 via ORAL
  Administered 2018-11-14 – 2018-11-16 (×3): 1 via ORAL
  Filled 2018-11-12 (×4): qty 1
  Filled 2018-11-12 (×2): qty 2

## 2018-11-12 MED ORDER — PANTOPRAZOLE SODIUM 40 MG PO TBEC
40.0000 mg | DELAYED_RELEASE_TABLET | Freq: Every day | ORAL | Status: DC
  Administered 2018-11-12 – 2018-11-16 (×5): 40 mg via ORAL
  Filled 2018-11-12 (×5): qty 1

## 2018-11-12 MED ORDER — ONDANSETRON HCL 4 MG PO TABS: 4.0000 mg | ORAL_TABLET | Freq: Four times a day (QID) | ORAL | Status: DC | PRN

## 2018-11-12 NOTE — ED Notes (Addendum)
Date and time results received: 11/12/18 6:20 AM  Test: lactic acid Critical Value: 5.5  Name of Provider Notified: Dr Beather Arbour

## 2018-11-12 NOTE — Progress Notes (Signed)
CODE SEPSIS - PHARMACY COMMUNICATION  **Broad Spectrum Antibiotics should be administered within 1 hour of Sepsis diagnosis**  Time Code Sepsis Called/Page Received: 0124 0506  Antibiotics Ordered: no abx ordered by provider calling code sepsis  Time of 1st antibiotic administration:   Additional action taken by pharmacy:   If necessary, Name of Provider/Nurse Contacted:     Eloise Harman ,PharmD Clinical Pharmacist  11/12/2018  6:49 AM

## 2018-11-12 NOTE — ED Provider Notes (Signed)
[facility] Emergency Department Provider Note   ____________________________________________   First MD Initiated Contact with Patient 11/12/18 0505     (approximate)  I have reviewed the triage vital signs and the nursing notes.   HISTORY  Chief Complaint Shortness of Breath    HPI Dennis Kim is a 23 y.o. male who presents to the ED from home with a chief complaint of sore throat, cough, chest pain, shortness of breath and rash.  Patient was diagnosed with influenza A on 1/17 by his PCP.  He was out of the window for Tamiflu.  At that time he was already on Bactrim for presumed prostatitis.  He was seen in the ED on 1/22 status post syncopal episode.  Had rash at the time which was thought to be post influenza exanthem.  Prescribed Prednisone of which he has taken 1 dose.  This morning complains fever, chills, sore throat, nonproductive cough, chest pain, shortness of breath, worsening rash.  Rash feels hot but is not itchy.  Does not remember where on his body the rash first started.  Denies headache, vision changes, neck pain, abdominal pain, nausea, vomiting, dysuria, diarrhea.  Denies recent travel or trauma.  [place]   Past Medical History:  Diagnosis Date  . Asthma     Patient Active Problem List   Diagnosis Date Noted  . Stevens-Johnson syndrome ([place]) 11/12/2018  . Influenza A 11/07/2018  . Urinary frequency 10/11/2018  . Mild persistent asthma 07/30/2018  . Seasonal and perennial allergic rhinitis 07/30/2018  . Asthma 07/18/2018  . Chest pain 07/18/2018  . Dyspnea on exertion 07/16/2018  . Palpitations 07/16/2018  . Hypertrophic cardiomyopathy ([place]) 07/16/2018  . Preventative health care 05/20/2016    Past Surgical History:  Procedure Laterality Date  . NO PAST SURGERIES      Prior to Admission medications   Medication Sig Start Date End Date Taking? Authorizing Provider  meloxicam (MOBIC) 15 MG tablet Take 1  tablet by mouth daily. 10/22/18  Yes [provider]  pantoprazole (PROTONIX) 40 MG tablet Take 1 tablet (40 mg total) by mouth daily. 10/04/18  Yes Ladene Artist, MD  predniSONE (DELTASONE) 10 MG tablet Take 1 tablet (10 mg total) by mouth daily. Day 1-2: Take 50 mg  ( 5 pills) Day 3-4 : Take 40 mg (4pills) Day 5-6: Take 30 mg (3 pills) Day 7-8:  Take 20 mg (2 pills) Day 9:  Take 10mg  (1 pill) 11/10/18  Yes Merlyn Lot, MD  PROAIR HFA 108 [phone] Base) MCG/ACT inhaler Inhale 2 puffs into the lungs every 4 (four) hours as needed for wheezing or shortness of breath. 11/08/18  Yes [provider]  sulfamethoxazole-trimethoprim (BACTRIM DS,SEPTRA DS) 800-160 MG tablet Take 1 tablet by mouth 2 (two) times daily. Urologist 10/23/18 11/22/18 Yes [provider]    Allergies Patient has no known allergies.  Family History  Problem Relation Age of Onset  . Brain cancer Maternal Grandmother   . Thyroid disease Father   . Breast cancer Paternal Grandmother   . Kidney disease Paternal Uncle     Social History Social History   Tobacco Use  . Smoking status: Never Smoker  . Smokeless tobacco: Never Used  Substance Use Topics  . Alcohol use: Yes    Comment: rare  . Drug use: No    Review of Systems  Constitutional: Positive for fever/chills Eyes: No visual changes. ENT: Positive for sore throat. Cardiovascular: Positive for chest  pain. Respiratory: Positive for shortness of breath. Gastrointestinal: No abdominal pain.  No nausea, no vomiting.  No diarrhea.  No constipation. Genitourinary: Negative for dysuria. Musculoskeletal: Negative for back pain. Skin: Negative for rash. Neurological: Negative for headaches, focal weakness or numbness.   ____________________________________________   PHYSICAL EXAM:  VITAL SIGNS: ED Triage Vitals  Enc Vitals Group     BP 11/12/18 0504 (!) 109/58     Pulse Rate 11/12/18 0504 (!) 149     Resp 11/12/18 0504 (!) 22      Temp 11/12/18 0504 (!) 103.1 F (39.5 C)     Temp src --      SpO2 11/12/18 0504 100 %     Weight 11/12/18 0455 155 lb (70.3 kg)     Height 11/12/18 0455 5\' 7"  (1.702 m)     Head Circumference --      Peak Flow --      Pain Score 11/12/18 0455 9     Pain Loc --      Pain Edu? --      Excl. in [place]? --     Constitutional: Alert and oriented.  Ill appearing and in moderate acute distress. Eyes: Conjunctivae are normal. PERRL. EOMI. Head: Atraumatic. Nose: Congestion/rhinnorhea. Mouth/Throat: Mucous membranes are moist.  Oropharynx mildly erythematous without tonsillar swelling, exudates or peritonsillar abscess.  There is no hoarse or muffled voice.  There is no drooling.  No [place] spots.  Very minor mucosal involvement; patchy white streak on tongue Neck: No stridor.  Supple neck without meningismus. Hematological/Lymphatic/Immunilogical: No cervical lymphadenopathy. Cardiovascular: Tachycardic rate, regular rhythm. Grossly normal heart sounds.  Good peripheral circulation. Respiratory: Normal respiratory effort.  No retractions. Lungs CTAB. Gastrointestinal: Soft and nontender. No distention. No abdominal bruits. No CVA tenderness. Musculoskeletal: No lower extremity tenderness nor edema.  No joint effusions. Neurologic:  Normal speech and language. No gross focal neurologic deficits are appreciated. No gait instability. Skin:  Skin is warm, dry and intact.  Diffuse rash noted.  No petechiae.  Rash is blanching, maculopapular, purplish in patches.  There is no sloughing of the skin.  No Nikolsky sign.  No rash to palms. Psychiatric: Mood and affect are normal. Speech and behavior are normal.  ____________________________________________   LABS (all labs ordered are listed, but only abnormal results are displayed)  Labs Reviewed  LACTIC ACID, PLASMA - Abnormal; Notable for the following components:      Result Value   Lactic Acid, Venous 5.5 (*)    All other components within  normal limits  COMPREHENSIVE METABOLIC PANEL - Abnormal; Notable for the following components:   Potassium 3.2 (*)    Glucose, Bld 198 (*)    Calcium 8.6 (*)    Total Protein 6.4 (*)    ALT 81 (*)    All other components within normal limits  CBC WITH DIFFERENTIAL/PLATELET - Abnormal; Notable for the following components:   Neutro Abs 7.9 (*)    Monocytes Absolute 1.1 (*)    Abs Immature Granulocytes 0.27 (*)    All other components within normal limits  CULTURE, BLOOD (ROUTINE X 2)  CULTURE, BLOOD (ROUTINE X 2)  URINE CULTURE  GROUP A STREP BY PCR  TROPONIN I  MONONUCLEOSIS SCREEN  LACTIC ACID, PLASMA  URINALYSIS, ROUTINE W REFLEX MICROSCOPIC   ____________________________________________  EKG  ED ECG REPORT I, , J, the attending physician, personally viewed and interpreted this ECG.   Date: 11/12/2018  EKG Time: 0501  Rate: 138  Rhythm: sinus  tachycardia  Axis: Normal  Intervals:none  ST&T Change: Nonspecific  ____________________________________________  RADIOLOGY  ED MD interpretation: No pneumonia  Official radiology report(s): Dg Chest Port 1 View  Result Date: 11/12/2018 CLINICAL DATA:  Chest pain.  Recent flu diagnosis. EXAM: PORTABLE CHEST 1 VIEW COMPARISON:  11/05/2018 FINDINGS: Normal heart size and mediastinal contours. No acute infiltrate or edema. No effusion or pneumothorax. No acute osseous findings. IMPRESSION: Negative portable chest. Electronically Signed   By: Monte Fantasia M.D.   On: 11/12/2018 05:29    ____________________________________________   PROCEDURES  Procedure(s) performed: None  Procedures  Critical Care performed: Yes, see critical care note(s)   CRITICAL CARE Performed by: Paulette Blanch   Total critical care time: 45 minutes  Critical care time was exclusive of separately billable procedures and treating other patients.  Critical care was necessary to treat or prevent imminent or life-threatening  deterioration.  Critical care was time spent personally by me on the following activities: development of treatment plan with patient and/or surrogate as well as nursing, discussions with consultants, evaluation of patient's response to treatment, examination of patient, obtaining history from patient or surrogate, ordering and performing treatments and interventions, ordering and review of laboratory studies, ordering and review of radiographic studies, pulse oximetry and re-evaluation of patient's condition.  ____________________________________________   INITIAL IMPRESSION / ASSESSMENT AND PLAN / ED COURSE  As part of my medical decision making, I reviewed the following data within the [place] History obtained from family, Nursing notes reviewed and incorporated, Labs reviewed, EKG interpreted, Old chart reviewed, Radiograph reviewed, Discussed with admitting physician and Notes from prior ED visits   23 year old male with recent influenza A, on Bactrim for presumed prostatitis, status post 1 dose prednisone for rash 2 days ago who presents with fever, tachycardia, sore throat, cough and worsening rash.  Differential diagnosis includes but is not limited to influenza, strep throat, community-acquired pneumonia, sepsis, erythema multiforme A, Stevens-Johnson syndrome, TPN, etc.  ED code sepsis initiated.  IV fluid resuscitation initiated.  Will administer steroid dose.  There is no evidence of TEN for which patient needs to be transferred to a burn unit.  Anticipate admission to this facility.   Clinical Course as of Nov 12 657  Fri Nov 12, 2018  6295 Oral temperature 99.2 F.  Updated patient and father on CBC, CMP and troponin results.  Undressed patient to look at his feet and legs; no mucosal involvement.  Have discussed case with Dr. Marcille Blanco from hospital services who will evaluate patient in the emergency department for admission.   [JS]  [id number] Clarified with patient  - he has been taking Bactrim since 1/4; has not had any x 3 days. Prednisone dose 50mg  yesterday.   [JS]  2841 Did consider empiric antibiotics.  Discussed with hospitalist elevated lactate.  However, given that Bactrim is the most likely culprit of SJS, and there is no objective evidence of infection, will hold antibiotics for now.   [JS]    Clinical Course User Index [JS] Paulette Blanch, MD     ____________________________________________   FINAL CLINICAL IMPRESSION(S) / ED DIAGNOSES  Final diagnoses:  Fever, unspecified fever cause  Stevens-Johnson syndrome ([place])  Sepsis, due to unspecified organism, unspecified whether acute organ dysfunction present [facility])     ED Discharge Orders    None       Note:  This document was prepared using Dragon voice recognition software and may include unintentional dictation errors.    Beather Arbour,  Gretchen Short, MD 11/12/18 [phone]

## 2018-11-12 NOTE — ED Notes (Signed)
Pt tried to urinate to collect a sample but was unable to produce any urine. Will try again in 30 minutes.

## 2018-11-12 NOTE — ED Notes (Signed)
Pt exhibiting an erythematous rash visible over chest, neck, bilateral arms, abdomen, bilateral legs. Pt states it itches and burns and is worst on his anterior thighs and behind the knees.

## 2018-11-12 NOTE — Progress Notes (Signed)
Per MD okay for RN to place order for 1-2 percocet order for pain. PRN Q6.

## 2018-11-12 NOTE — Progress Notes (Signed)
Notified MD pt HR is 146. Per MD nurse to give NS 500 ML bolus. Will continue to assess and monitor.

## 2018-11-12 NOTE — Plan of Care (Addendum)
Dr. Text to inform that patient's temp is remaining high despite tylenol and Ibuprophen. Staff will apply ice packs and re-assess.

## 2018-11-12 NOTE — ED Notes (Signed)
Pt tried to urinate again and was unable to produce any urine. Will attempt again later. Dr Beather Arbour notified.

## 2018-11-12 NOTE — ED Triage Notes (Signed)
Patient ambulatory to triage with steady gait, without difficulty, appears anxious; Dx with influenza Friday; returned to ED Wed for rash and persistent symptoms; returns this am for upper CP and diff breathing

## 2018-11-12 NOTE — Consult Note (Signed)
SURGICAL CONSULTATION NOTE   HISTORY OF PRESENT ILLNESS (HPI):  23 y.o. male admitted to the hospital due to Stevens-Johnson syndrome. Patient reports starting with a rash around 10 days ago. The rash has progressively getting worse. Last night he started feeling hot and with shortness of breath and decided to come to the hospital again. Refers recent influenza symptoms. He was also receiving treatment for suspected prostatitis with Bactrim. In the last few days the rash has progressed rapidly and feels warm. Refers associatd fever. Generalized pain. No pain radiation. No aggravator or alleviator factor. Patient admitted by Medicine service and started on treatment for [name] with steroids.  Surgery is consulted by Dr. Terrilee Files in this context for evaluation and management of skin biopsy.  PAST MEDICAL HISTORY (PMH):  Past Medical History:  Diagnosis Date  . Asthma      PAST SURGICAL HISTORY (Elsie):  Past Surgical History:  Procedure Laterality Date  . NO PAST SURGERIES       MEDICATIONS:  Prior to Admission medications   Medication Sig Start Date End Date Taking? Authorizing Provider  meloxicam (MOBIC) 15 MG tablet Take 1 tablet by mouth daily. 10/22/18  Yes [provider]  pantoprazole (PROTONIX) 40 MG tablet Take 1 tablet (40 mg total) by mouth daily. 10/04/18  Yes Ladene Artist, MD  predniSONE (DELTASONE) 10 MG tablet Take 1 tablet (10 mg total) by mouth daily. Day 1-2: Take 50 mg  ( 5 pills) Day 3-4 : Take 40 mg (4pills) Day 5-6: Take 30 mg (3 pills) Day 7-8:  Take 20 mg (2 pills) Day 9:  Take 10mg  (1 pill) 11/10/18  Yes Merlyn Lot, MD  PROAIR HFA 108 [phone] Base) MCG/ACT inhaler Inhale 2 puffs into the lungs every 4 (four) hours as needed for wheezing or shortness of breath. 11/08/18  Yes [provider]  sulfamethoxazole-trimethoprim (BACTRIM DS,SEPTRA DS) 800-160 MG tablet Take 1 tablet by mouth 2 (two) times daily. Urologist 10/23/18 11/22/18 Yes [provider]     ALLERGIES:  Allergies  Allergen Reactions  . Bactrim [Sulfamethoxazole-Trimethoprim]     [name] syndrome     SOCIAL HISTORY:  Social History   Socioeconomic History  . Marital status: Single    Spouse name: Not on file  . Number of children: Not on file  . Years of education: Not on file  . Highest education level: Not on file  Occupational History    Comment: student [employer]  Social Needs  . Financial resource strain: Not on file  . Food insecurity:    Worry: Not on file    Inability: Not on file  . Transportation needs:    Medical: Not on file    Non-medical: Not on file  Tobacco Use  . Smoking status: Never Smoker  . Smokeless tobacco: Never Used  Substance and Sexual Activity  . Alcohol use: Yes    Comment: rare  . Drug use: No  . Sexual activity: Never    Partners: Female  Lifestyle  . Physical activity:    Days per week: Not on file    Minutes per session: Not on file  . Stress: Not on file  Relationships  . Social connections:    Talks on phone: Not on file    Gets together: Not on file    Attends religious service: Not on file    Active member of club or organization: Not on file    Attends meetings of clubs or organizations:  Not on file    Relationship status: Not on file  . Intimate partner violence:    Fear of current or ex partner: Not on file    Emotionally abused: Not on file    Physically abused: Not on file    Forced sexual activity: Not on file  Other Topics Concern  . Not on file  Social History Narrative  . Not on file    The patient currently resides (home / rehab facility / nursing home): Home The patient normally is (ambulatory / bedbound): Ambulatory   FAMILY HISTORY:  Family History  Problem Relation Age of Onset  . Brain cancer Maternal Grandmother   . Thyroid disease Father   . Breast cancer Paternal Grandmother   . Kidney disease Paternal Uncle      REVIEW OF SYSTEMS:  Constitutional:  denies weight loss, Positive for fever, chills, and sweats  Eyes: denies any other vision changes, history of eye injury  ENT: denies sore throat, hearing problems  Respiratory: Positive for shortness of breath Cardiovascular: denies chest pain, palpitations  Gastrointestinal: denies abdominal pain, N/V,  Genitourinary: denies burning with urination or urinary frequency Musculoskeletal: denies any other joint pains or cramps  Skin: positive for generalized rash Neurological: denies any other headache, dizziness, weakness  Psychiatric: denies any other depression, anxiety   All other review of systems were negative   VITAL SIGNS:  Temp:  [98.3 F (36.8 C)-103.1 F (39.5 C)] 99 F (37.2 C) (01/24 1843) Pulse Rate:  [96-150] 96 (01/24 1843) Resp:  [16-22] 16 (01/24 1843) BP: (100-113)/(48-58) 101/52 (01/24 1512) SpO2:  [96 %-100 %] 98 % (01/24 1843) Weight:  [70.3 kg] 70.3 kg (01/24 0455)     Height: 5\' 7"  (170.2 cm) Weight: 70.3 kg BMI (Calculated): 24.27   INTAKE/OUTPUT:  This shift: Total I/O In: 1671.5 [I.V.:552; IV Piggyback:1119.5] Out: 800 [Urine:800]  Last 2 shifts: @IOLAST2SHIFTS @   PHYSICAL EXAM:  Constitutional:  -- Normal body habitus  -- Awake, alert, and oriented x3  Eyes:  -- Pupils equally round and reactive to light  -- No scleral icterus  Ear, nose, and throat:  -- No jugular venous distension  Pulmonary:  -- No crackles  -- Equal breath sounds bilaterally -- Breathing non-labored at rest Cardiovascular:  -- S1, S2 present  -- No pericardial rubs Gastrointestinal:  -- Abdomen soft, nontender, non-distended, no guarding or rebound tenderness -- No abdominal masses appreciated, pulsatile or otherwise  Musculoskeletal and Integumentary:  -- Erythematous rash generalized in the upper and lower body, no necrotic tissue. No ischemia. Neurologic:  -- Motor function: intact and symmetric -- Sensation: intact and symmetric   Labs:  CBC Latest Ref Rng &  Units 11/12/2018 11/10/2018 11/10/2018  WBC 4.0 - 10.5 K/uL 10.3 7.9 8.1  Hemoglobin 13.0 - 17.0 g/dL 14.9 15.1 15.1  Hematocrit 39.0 - 52.0 % 42.3 42.6 42.4  Platelets 150 - 400 K/uL 287 253 237   CMP Latest Ref Rng & Units 11/12/2018 11/10/2018 08/30/2018  Glucose 70 - 99 mg/dL 198(H) 137(H) 83  BUN 6 - 20 mg/dL 7 13 19   Creatinine 0.61 - 1.24 mg/dL 1.00 0.96 0.82  Sodium 135 - 145 mmol/L 138 133(L) 141  Potassium 3.5 - 5.1 mmol/L 3.2(L) 4.0 3.9  Chloride 98 - 111 mmol/L 104 101 102  CO2 22 - 32 mmol/L 22 23 34(H)  Calcium 8.9 - 10.3 mg/dL 8.6(L) 8.8(L) 9.9  Total Protein 6.5 - 8.1 g/dL 6.4(L) 7.1 7.4  Total Bilirubin 0.3 -  1.2 mg/dL 0.6 0.8 0.4  Alkaline Phos 38 - 126 U/L 84 108 84  AST 15 - 41 U/L 38 63(H) 20  ALT 0 - 44 U/L 81(H) 106(H) 35   Imaging studies:  EXAM: PORTABLE CHEST 1 VIEW  COMPARISON:  11/05/2018  FINDINGS: Normal heart size and mediastinal contours. No acute infiltrate or edema. No effusion or pneumothorax. No acute osseous findings.  IMPRESSION: Negative portable chest.   Electronically Signed   By: Monte Fantasia M.D.   On: 11/12/2018 05:29  Assessment/Plan:  23 y.o. male with [name] syndrome. The clinical history and the physical exam is consistent with [name] rash. I discussed the case with Dr. Terrilee Files and skin biopsy is recommended for tissue culture and confirmation of diagnosis. Will coordinate with Pathology and nursing staff to perform biopsy bedside. Patient father oriented about the biopsy procedure.   Arnold Long, MD

## 2018-11-12 NOTE — H&P (Signed)
Dennis Kim is an 23 y.o. male.   Chief Complaint: Shortness of breath HPI: The patient with past medical history of asthma as well as borderline hypertrophic cardiomyopathy (full work-up shows the patient is not in current danger of sudden cardiac arrest) presents to the emergency department for the second time this week due to rash.  Patient's initial complaint tonight with shortness of breath which he admits was secondary to feeling impressively hot due to his rash and skin temperature.  The patient states that the rash on his upper body appears to be marginally better than earlier this week when he presented to the emergency department for the same complaint.  However the rash behind his knees and his upper thighs is much worse.  His rash began approximately 10 days ago.  He had recently been diagnosed with influenza A but he has not been on Tamiflu due to exceeding the treatment window.  For the last 17 days the patient has also been on Bactrim twice daily for suspected prostatitis.  (Notably the patient does not have any urinary symptoms).  Initially the patient's rash was thought to be a viral exanthem however the widespread nature of the rash as well as fever and overall discomfort are now concerning for Stevens-Johnson syndrome.  The patient received Solu-Medrol in the emergency department prior to the hospital service being asked for admission.  Past Medical History:  Diagnosis Date  . Asthma     Past Surgical History:  Procedure Laterality Date  . NO PAST SURGERIES      Family History  Problem Relation Age of Onset  . Brain cancer Maternal Grandmother   . Thyroid disease Father   . Breast cancer Paternal Grandmother   . Kidney disease Paternal Uncle    Social History:  reports that he has never smoked. He has never used smokeless tobacco. He reports current alcohol use. He reports that he does not use drugs.  Allergies: No Known Allergies  Medications Prior to Admission   Medication Sig Dispense Refill  . meloxicam (MOBIC) 15 MG tablet Take 1 tablet by mouth daily.    . pantoprazole (PROTONIX) 40 MG tablet Take 1 tablet (40 mg total) by mouth daily. 30 tablet 11  . predniSONE (DELTASONE) 10 MG tablet Take 1 tablet (10 mg total) by mouth daily. Day 1-2: Take 50 mg  ( 5 pills) Day 3-4 : Take 40 mg (4pills) Day 5-6: Take 30 mg (3 pills) Day 7-8:  Take 20 mg (2 pills) Day 9:  Take 10mg  (1 pill) 29 tablet 0  . PROAIR HFA 108 (90 Base) MCG/ACT inhaler Inhale 2 puffs into the lungs every 4 (four) hours as needed for wheezing or shortness of breath.    . sulfamethoxazole-trimethoprim (BACTRIM DS,SEPTRA DS) 800-160 MG tablet Take 1 tablet by mouth 2 (two) times daily. Urologist      Results for orders placed or performed during the hospital encounter of 11/12/18 (from the past 48 hour(s))  Lactic acid, plasma     Status: Abnormal   Collection Time: 11/12/18  5:16 AM  Result Value Ref Range   Lactic Acid, Venous 5.5 (HH) 0.5 - 1.9 mmol/L    Comment: CRITICAL RESULT CALLED TO, READ BACK BY AND VERIFIED WITH THOMAS NAGY AT 0620 11/12/2018.PMF Performed at [facility], [address] [place], [place] [address]   Comprehensive metabolic panel     Status: Abnormal   Collection Time: 11/12/18  5:16 AM  Result Value Ref Range  Sodium 138 135 - 145 mmol/L   Potassium 3.2 (L) 3.5 - 5.1 mmol/L   Chloride 104 98 - 111 mmol/L   CO2 22 22 - 32 mmol/L   Glucose, Bld 198 (H) 70 - 99 mg/dL   BUN 7 6 - 20 mg/dL   Creatinine, Ser 1.00 0.61 - 1.24 mg/dL   Calcium 8.6 (L) 8.9 - 10.3 mg/dL   Total Protein 6.4 (L) 6.5 - 8.1 g/dL   Albumin 3.6 3.5 - 5.0 g/dL   AST 38 15 - 41 U/L   ALT 81 (H) 0 - 44 U/L   Alkaline Phosphatase 84 38 - 126 U/L   Total Bilirubin 0.6 0.3 - 1.2 mg/dL   GFR calc non Af Amer >60 >60 mL/min   GFR calc Af Amer >60 >60 mL/min   Anion gap 12 5 - 15    Comment: Performed at [facility], [address] [place], [place] [address]  CBC  WITH DIFFERENTIAL     Status: Abnormal   Collection Time: 11/12/18  5:16 AM  Result Value Ref Range   WBC 10.3 4.0 - 10.5 K/uL   RBC 4.90 4.22 - 5.81 MIL/uL   Hemoglobin 14.9 13.0 - 17.0 g/dL   HCT 42.3 39.0 - 52.0 %   MCV 86.3 80.0 - 100.0 fL   MCH 30.4 26.0 - 34.0 pg   MCHC 35.2 30.0 - 36.0 g/dL   RDW 12.1 11.5 - 15.5 %   Platelets 287 150 - 400 K/uL   nRBC 0.0 0.0 - 0.2 %   Neutrophils Relative % 76 %   Neutro Abs 7.9 (H) 1.7 - 7.7 K/uL   Lymphocytes Relative 10 %   Lymphs Abs 1.0 0.7 - 4.0 K/uL   Monocytes Relative 10 %   Monocytes Absolute 1.1 (H) 0.1 - 1.0 K/uL   Eosinophils Relative 1 %   Eosinophils Absolute 0.1 0.0 - 0.5 K/uL   Basophils Relative 0 %   Basophils Absolute 0.0 0.0 - 0.1 K/uL   Immature Granulocytes 3 %   Abs Immature Granulocytes 0.27 (H) 0.00 - 0.07 K/uL    Comment: Performed at [facility], [address] [place], [place] [address]  Urinalysis, Routine w reflex microscopic     Status: Abnormal   Collection Time: 11/12/18  5:16 AM  Result Value Ref Range   Color, Urine STRAW (A) YELLOW   APPearance CLEAR (A) CLEAR   Specific Gravity, Urine 1.006 1.005 - 1.030   pH 6.0 5.0 - 8.0   Glucose, UA NEGATIVE NEGATIVE mg/dL   Hgb urine dipstick SMALL (A) NEGATIVE   Bilirubin Urine NEGATIVE NEGATIVE   Ketones, ur NEGATIVE NEGATIVE mg/dL   Protein, ur NEGATIVE NEGATIVE mg/dL   Nitrite NEGATIVE NEGATIVE   Leukocytes, UA NEGATIVE NEGATIVE   RBC / HPF 0-5 0 - 5 RBC/hpf   WBC, UA 0-5 0 - 5 WBC/hpf   Bacteria, UA NONE SEEN NONE SEEN   Squamous Epithelial / LPF NONE SEEN 0 - 5    Comment: Performed at [facility], [address] [place], [place] [address]  Troponin I - ONCE - STAT     Status: None   Collection Time: 11/12/18  5:16 AM  Result Value Ref Range   Troponin I <0.03 <0.03 ng/mL    Comment: Performed at [facility], [address] [place], [place] [address]  Group A Strep by PCR     Status: None   Collection Time:  11/12/18  5:16 AM  Result Value Ref Range   Group A Strep by PCR NOT DETECTED NOT DETECTED    Comment: Performed at [facility], [address] [place], [place] [address]  Mononucleosis screen     Status: None   Collection Time: 11/12/18  5:16 AM  Result Value Ref Range   Mono Screen NEGATIVE NEGATIVE    Comment: Performed at [facility], [address] [place], [place] [address]   Dg Chest Port 1 View  Result Date: 11/12/2018 CLINICAL DATA:  Chest pain.  Recent flu diagnosis. EXAM: PORTABLE CHEST 1 VIEW COMPARISON:  11/05/2018 FINDINGS: Normal heart size and mediastinal contours. No acute infiltrate or edema. No effusion or pneumothorax. No acute osseous findings. IMPRESSION: Negative portable chest. Electronically Signed   By: Monte Fantasia M.D.   On: 11/12/2018 05:29    Review of Systems  Constitutional: Positive for fever. Negative for chills.  HENT: Negative for sore throat and tinnitus.   Eyes: Negative for blurred vision and redness.  Respiratory: Positive for shortness of breath. Negative for cough and wheezing.   Cardiovascular: Negative for chest pain, palpitations, orthopnea and PND.  Gastrointestinal: Negative for abdominal pain, diarrhea, nausea and vomiting.  Genitourinary: Negative for dysuria, frequency and urgency.  Musculoskeletal: Negative for joint pain and myalgias.  Skin: Negative for rash.       No lesions  Neurological: Negative for speech change, focal weakness and weakness.  Endo/Heme/Allergies: Does not bruise/bleed easily.       No temperature intolerance  Psychiatric/Behavioral: Negative for depression and suicidal ideas.    Blood pressure (!) 102/55, pulse (!) 111, temperature 98.3 F (36.8 C), temperature source Oral, resp. rate 17, height 5\' 7"  (1.702 m), weight 70.3 kg, SpO2 97 %. Physical Exam  Vitals reviewed. Constitutional: He is oriented to person, place, and time. He appears well-developed and well-nourished. No  distress.  HENT:  Head: Normocephalic and atraumatic.  Eyes: Pupils are equal, round, and reactive to light. Conjunctivae and EOM are normal.  Neck: Normal range of motion. Neck supple. No JVD present. No tracheal deviation present. No thyromegaly present.  Cardiovascular: Normal rate, regular rhythm and normal heart sounds. Exam reveals no gallop.  No murmur heard. Respiratory: Effort normal and breath sounds normal. No respiratory distress.  GI: Soft. Bowel sounds are normal. He exhibits no distension. There is no abdominal tenderness.  Genitourinary:    Genitourinary Comments: Deferred   Musculoskeletal: Normal range of motion.        General: No edema.  Lymphadenopathy:    He has no cervical adenopathy.  Neurological: He is alert and oriented to person, place, and time. No cranial nerve deficit.  Skin: Skin is warm and dry. Rash (Erythematous and lacy; maculopapular.  Slightly tender to touch) noted. There is erythema.  Psychiatric: He has a normal mood and affect. His behavior is normal. Judgment and thought content normal.     Assessment/Plan This is a 23 year old male admitted for even Johnson syndrome. 1.  Stevens-Johnson syndrome: Continue steroid therapy.  Consult dermatology if possible.  Continue to hold Bactrim.  (The patient stopped this medication 4 days ago).  There is no sloughing of skin or mucous membrane involvement. 2.  Sepsis: The patient meets criteria via tachycardia, fever and tachypnea.  Lactic acid is also elevated.  He is hemodynamically stable.  Secondary to immune mediated rash. 3.  Hypokalemia: Replete potassium. 4.  DVT prophylaxis: Lovenox 5.  GI prophylaxis: Pantoprazole per home regimen The patient is  a full code.  Time spent on admission orders and patient care approximately 45 minutes  Harrie Foreman, MD 11/12/2018, 8:17 AM

## 2018-11-12 NOTE — ED Notes (Signed)
Report to Juan, RN.

## 2018-11-12 NOTE — Progress Notes (Addendum)
[place] [facility] [place] NAME: Vitali Martinique    MR#:  [id number]  DATE OF BIRTH:  Apr 01, 1996  SUBJECTIVE:  CHIEF COMPLAINT:   Chief Complaint  Patient presents with  . Shortness of Breath   Patient had fevers with temperature of 103.1 on presentation.  No fevers this morning.  Feeling better this morning.  Denies any soreness in his oral mucosa.  No shortness of breath or chest pain this morning.  Patient has dad at bedside.  Updated on treatment plans as outlined below.  REVIEW OF SYSTEMS:  Review of Systems  Constitutional: Negative for chills.       Fevers appear resolved this morning.  HENT: Negative for hearing loss and tinnitus.   Eyes: Negative for blurred vision and double vision.  Respiratory: Negative for cough and hemoptysis.   Cardiovascular: Negative for chest pain and palpitations.  Gastrointestinal: Negative for heartburn and nausea.  Genitourinary: Negative for dysuria and urgency.  Musculoskeletal: Negative for back pain, myalgias and neck pain.  Skin: Positive for itching and rash.  Neurological: Negative for dizziness, tingling and headaches.  Psychiatric/Behavioral: Negative for depression and substance abuse.    DRUG ALLERGIES:  No Known Allergies VITALS:  Blood pressure (!) 102/55, pulse (!) 111, temperature 98.3 F (36.8 C), temperature source Oral, resp. rate 17, height '5\' 7"'  (1.702 m), weight 70.3 kg, SpO2 97 %. PHYSICAL EXAMINATION:   Constitutional: He is oriented to person, place, and time. He appears well-developed and well-nourished. No distress.  HENT:  Head: Normocephalic and atraumatic.  No evidence of any ulcers in the oral mucosa. Eyes: Pupils are equal, round, and reactive to light. Conjunctivae and EOM are normal.  Neck: Normal range of motion. Neck supple. No JVD present. No tracheal deviation present. No thyromegaly present.  Cardiovascular: Normal rate, regular rhythm and normal heart sounds.  Exam reveals no gallop.  No murmur heard. Respiratory: Effort normal and breath sounds normal. No respiratory distress.  GI: Soft. Bowel sounds are normal. He exhibits no distension. There is no abdominal tenderness.  Musculoskeletal: Normal range of motion.        General: No edema.  Lymphadenopathy:    He has no cervical adenopathy.  Neurological: He is alert and oriented to person, place, and time. No cranial nerve deficit.  Skin:  Generalized skin rash involving both trunk and extremities.  No involvement of oral mucosa. Rash (Erythematous and lacy; maculopapular.    Nontender.  There is erythema.  Psychiatric: He has a normal mood and affect. His behavior is normal. Judgment and thought content normal.   LABORATORY PANEL:  Male CBC Recent Labs  Lab 11/12/18 0516  WBC 10.3  HGB 14.9  HCT 42.3  PLT 287   ------------------------------------------------------------------------------------------------------------------ Chemistries  Recent Labs  Lab 11/12/18 0516  NA 138  K 3.2*  CL 104  CO2 22  GLUCOSE 198*  BUN 7  CREATININE 1.00  CALCIUM 8.6*  AST 38  ALT 81*  ALKPHOS 84  BILITOT 0.6   RADIOLOGY:  Dg Chest Port 1 View  Result Date: 11/12/2018 CLINICAL DATA:  Chest pain.  Recent flu diagnosis. EXAM: PORTABLE CHEST 1 VIEW COMPARISON:  11/05/2018 FINDINGS: Normal heart size and mediastinal contours. No acute infiltrate or edema. No effusion or pneumothorax. No acute osseous findings. IMPRESSION: Negative portable chest. Electronically Signed   By: Monte Fantasia M.D.   On: 11/12/2018 05:29   ASSESSMENT AND PLAN:   This is a 23 year old male admitted  for [employer] syndrome.  1.  Stevens-Johnson syndrome:  Presented with fevers with generalized erythematous and maculopapular, lacy rash. Secondary to use of Bactrim recently.  Rash said to have begun approximately 10 days ago and progressively gotten worse.  Already stopped taking Bactrim about 4 days  ago. Patient was started on IV Solu-Medrol in the emergency room.  Continuing the same.  Reassess in a.m. if any improvement with ongoing steroid therapy. Patient does not have any involvement of oral mucosa.  No evidence of any airway compromise.  No shortness of breath this morning.  There is no sloughing of skin. Continue IV fluid hydration. I was just notified dermatologist is only available for consultation from Mondays through Thursdays and today is Friday. To monitor patient closely.  I was later notified that patient started having fevers again this afternoon.  Family concerned that patient appears slightly more edematous.  Patient otherwise awake and alert and oriented and does not appear to be in any respiratory distress. I called and discussed case with the physician on call Dr. Early Chars at the burns unit at [facility] for possible transfer due to concern for possible decline with diagnosis of Stevens-Johnson syndrome.  I discussed case extensively with him and he does not believe patient needs to be transferred to the burn unit at [facility].  Recommended: Hospitalist service at [facility]. I have also been notified by the transfer center they have a long waiting list and no beds available at this time.   I discussed case with hospitalist on call at [facility] who has accepted patient in transfer and placed patient on waiting list.  He also recommended getting surgery consult for possible skin biopsy while awaiting transfer to [facility].  Surgery consult placed. Accepting physician is Dr. Hezzie Bump Patient currently does not have any evidence of sloughing of skin or mucosal involvement. Bactrim added to his list of allergies.  2.  Sepsis: The patient met criteria via tachycardia, fever and tachypnea. Fevers appear to be resolved this morning.  Patient still tachycardic however.  Lactic acid level down to normal. Given an extra 500 cc normal saline bolus and then to be continued with normal saline at 100 cc an  hour Initiated empiric antibiotics while awaiting culture and sensitivity results.   3.  Hypokalemia: Replaced.  Follow-up on repeat levels in a.m.   4.  DVT prophylaxis: Lovenox   All the records are reviewed and case discussed with Care Management/Social Worker. Management plans discussed with the patient, family and they are in agreement.  CODE STATUS: Full Code  TOTAL TIME TAKING CARE OF THIS PATIENT: 38 minutes.   More than 50% of the time was spent in counseling/coordination of care: YES  POSSIBLE D/C IN 2 DAYS, DEPENDING ON CLINICAL CONDITION.   Helios Kohlmann M.D on 11/12/2018 at 1:27 PM  Between 7am to 6pm - Pager - [phone]  After 6pm go to [email or web address] - [employer] [facility]  Office  [phone]  CC: Primary care physician; Ann Held, DO  Note: This dictation was prepared with Dragon dictation along with smaller phrase technology. Any transcriptional errors that result from this process are unintentional.

## 2018-11-13 LAB — BASIC METABOLIC PANEL
Anion gap: 6 (ref 5–15)
BUN: 9 mg/dL (ref 6–20)
CHLORIDE: 110 mmol/L (ref 98–111)
CO2: 24 mmol/L (ref 22–32)
Calcium: 7.9 mg/dL — ABNORMAL LOW (ref 8.9–10.3)
Creatinine, Ser: 0.79 mg/dL (ref 0.61–1.24)
GFR calc Af Amer: >60 mL/min (ref >60)
GFR calc non Af Amer: >60 mL/min (ref >60)
Glucose, Bld: 154 mg/dL — ABNORMAL HIGH (ref 70–99)
Potassium: 4.2 mmol/L (ref 3.5–5.1)
Sodium: 140 mmol/L (ref 135–145)

## 2018-11-13 LAB — URINE CULTURE: Culture: NO GROWTH

## 2018-11-13 LAB — MAGNESIUM: Magnesium: 1.8 mg/dL (ref 1.7–2.4)

## 2018-11-13 MED ORDER — METHYLPREDNISOLONE SODIUM SUCC 40 MG IJ SOLR
40.0000 mg | Freq: Three times a day (TID) | INTRAMUSCULAR | Status: DC
  Administered 2018-11-13 – 2018-11-16 (×9): 40 mg via INTRAVENOUS
  Filled 2018-11-13 (×9): qty 1

## 2018-11-13 MED ORDER — LIDOCAINE-EPINEPHRINE 0.5 %-1:200000 IJ SOLN
30.0000 mL | Freq: Once | INTRAMUSCULAR | Status: AC
  Administered 2018-11-13: 30 mL
  Filled 2018-11-13: qty 30

## 2018-11-13 MED ORDER — ZOLPIDEM TARTRATE 5 MG PO TABS
5.0000 mg | ORAL_TABLET | Freq: Every evening | ORAL | Status: DC | PRN
  Administered 2018-11-13 – 2018-11-15 (×3): 5 mg via ORAL
  Filled 2018-11-13 (×3): qty 1

## 2018-11-13 MED ORDER — HYDROXYZINE HCL 10 MG PO TABS
10.0000 mg | ORAL_TABLET | Freq: Four times a day (QID) | ORAL | Status: DC | PRN
  Administered 2018-11-13 – 2018-11-15 (×4): 10 mg via ORAL
  Filled 2018-11-13 (×5): qty 1

## 2018-11-13 NOTE — Consult Note (Signed)
CC: Evaluation for [facility] Syndrome  HPI: Patient is a 23yo male with one week history of skin itching and rash.  He reports starting bactrim for a possible prostrate infection on January 3.  On January 13 he was diagnosed with Influenza A when he developed fevers and not feeling well; and did not start any new therapies but continued the bactrim.  On January 18 he developed skin itching and eventually stopped the bactrim on January 22.  His rash progressed and now covers most of his body except for his head.  He denies sores in his mouth, difficulty swallowing, ocular issues, or burning or pain with urination or sores on genitals.  He denies seeing any blisters.  Reports being tested for HIV in the past and being negative.  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    FH: Grandmother has history of drug rash to penicillin.  No other family history of severe drug rash.  ROS: Denies dysuria, pain with swallowing.  PE: Genaral: Patient is alert and oriented x 4.  He is not in acute distress.  Skin: Examination of the scalp, face, neck, chest, abdomen, back, groin, genitals, bilateral upper and lower extremities was performed and notable for: Non blanching widespread erythema and edema with vesicles on arms, trunk, and legs.  Purpuric patches on groin and lower extemities.  Nicolsky sign positive.  Labs: elevated AST and ALT.  No eosinophilia.  Elevated TSH.  Assessment and Plan:  23 yo male with widespread vesicular erythema which developed after starting bactrim concerning for Stevens-Johnson vs DRESS syndrome.  Patient had skin biopsies performed this morning by surgery.  Patient is currently awaiting transfer to [facility].  I agree with transfer given extensive erythema, vesiculation, and likely sloughing of the skin which will require specialized skin care.  Also patient will require monitoring for ocular or  oral involvement.  I recommend checking an HIV if it has not already been done.

## 2018-11-13 NOTE — Progress Notes (Signed)
[place] at [place] NAME: Dennis Kim    MR#:  [id number]  DATE OF BIRTH:  [birth year]  SUBJECTIVE:  Patient with fever last night Still with itching    REVIEW OF SYSTEMS:  Review of Systems  Constitutional: Positive for fever. Negative for chills and malaise/fatigue.       Fevers appear resolved this morning.  HENT: Negative.  Negative for ear discharge, ear pain, hearing loss, nosebleeds, sore throat and tinnitus.   Eyes: Negative.  Negative for blurred vision, double vision and pain.  Respiratory: Negative.  Negative for cough, hemoptysis, shortness of breath and wheezing.   Cardiovascular: Negative.  Negative for chest pain, palpitations and leg swelling.  Gastrointestinal: Negative.  Negative for abdominal pain, blood in stool, diarrhea, heartburn, nausea and vomiting.  Genitourinary: Negative.  Negative for dysuria and urgency.  Musculoskeletal: Negative.  Negative for back pain, myalgias and neck pain.  Skin: Positive for itching and rash.  Neurological: Negative for dizziness, tingling, tremors, speech change, focal weakness, seizures and headaches.  Endo/Heme/Allergies: Negative.  Does not bruise/bleed easily.  Psychiatric/Behavioral: Negative.  Negative for depression, hallucinations, substance abuse and suicidal ideas.    DRUG ALLERGIES:   Allergies  Allergen Reactions  . Bactrim [Sulfamethoxazole-Trimethoprim]     Dennis Kim syndrome   VITALS:  Blood pressure (!) 107/53, pulse (!) 118, temperature 99.7 F (37.6 C), temperature source Oral, resp. rate 18, height 5\' 7"  (1.702 m), weight 70.5 kg, SpO2 96 %. PHYSICAL EXAMINATION:   Constitutional: He is oriented to person, place, and time. He appears well-developed and well-nourished. No distress.  HENT:  Head: Normocephalic and atraumatic.  No evidence of any ulcers in the oral mucosa. Eyes: Pupils are equal, round, and reactive to light. Conjunctivae and EOM are  normal.  Neck: Normal range of motion. Neck supple. No JVD present. No tracheal deviation present. No thyromegaly present.  Cardiovascular: Normal rate, regular rhythm and normal heart sounds. Exam reveals no gallop.  No murmur heard. Respiratory: Effort normal and breath sounds normal. No respiratory distress.  GI: Soft. Bowel sounds are normal. He exhibits no distension. There is no abdominal tenderness.  Musculoskeletal: Normal range of motion.        General: No edema.  Lymphadenopathy:    He has no cervical adenopathy.  Neurological: He is alert and oriented to person, place, and time. No cranial nerve deficit.  Skin:  Generalized skin rash involving both trunk and extremities.  No involvement of oral mucosa. Rash (Erythematous and lacy; maculopapular.    Nontender.  There is erythema.  Psychiatric: He has a normal mood and affect. His behavior is normal. Judgment and thought content normal.   LABORATORY PANEL:  Male CBC Recent Labs  Lab 11/12/18 0516  WBC 10.3  HGB 14.9  HCT 42.3  PLT 287   ------------------------------------------------------------------------------------------------------------------ Chemistries  Recent Labs  Lab 11/12/18 0516 11/13/18 0339  NA 138 140  K 3.2* 4.2  CL 104 110  CO2 22 24  GLUCOSE 198* 154*  BUN 7 9  CREATININE 1.00 0.79  CALCIUM 8.6* 7.9*  MG  --  1.8  AST 38  --   ALT 81*  --   ALKPHOS 84  --   BILITOT 0.6  --    RADIOLOGY:  No results found. ASSESSMENT AND PLAN:   This is a 22 year old male admitted for Dennis Kim syndrome.  1.    Presumed Stevens-Johnson syndrome from Bactrim:  Patient is status post  biopsy this morning by surgery  Dermatology consultation placed  Continue symptom management with IV fluids, pain control and anti-itching.    Awaiting bed at [facility] for transfer accepting physician is Dr. Hezzie Bump Patient currently does not have any evidence of sloughing of skin or mucosal involvement. Bactrim  added to allergy list Sepsis has been ruled out with negative cultures and NO UTI/PNA STOP ABX  2.  Hypokalemia: Repleted   I left message with dermatologist.  Management plans discussed with the patient and family and they are in agreement.  CODE STATUS: Full Code  TOTAL TIME TAKING CARE OF THIS PATIENT: 36 minutes.   More than 50% of the time was spent in counseling/coordination of care: YES  POSSIBLE D/C IN 2 DAYS, DEPENDING ON CLINICAL CONDITION.   Link Burgeson M.D on 11/13/2018 at 10:16 AM  Between 7am to 6pm - Pager - [phone]  After 6pm go to [email or web address] - [employer] [facility]  Office  [phone]  CC: Primary care physician; Ann Held, DO  Note: This dictation was prepared with Dragon dictation along with smaller phrase technology. Any transcriptional errors that result from this process are unintentional.

## 2018-11-13 NOTE — Procedures (Signed)
  Anesthesia: Local   Procedure: Excision of skin lesion CPT: 11104, 11105x2   Surgeon: Dr. Windell Moment   Indication: This 23 year old male with a generalized maculopapular rash in the whole body, biopsy done for confirmation of Steven-johnson syndrome and tissue culture.    Description of procedure: after orienting patient about the procedure steps and benefits and patient agreed to proceed. Time out was done identifying correct patient and location of procedure. Local anesthesia was infiltrated in the right thigh, left thigh and right lower abdomen as this are the area of major skin changes. With a Disposable Biopsy punch, 4 mm punch biopsy of the skin were taken in the three spot anethesized. Hemostasis was checked and the skin closed with steri strips. Specimen sent to pathology.    Complications: none   EBL: minimal

## 2018-11-13 NOTE — Progress Notes (Signed)
Called Dr. Marcille Blanco regarding patient's elevated heart rate of 126.  Patient is on fluids and will continue to be monitored.  Patient had a low grade temp of 100.6 and ibuprofen 800 mg was given. Will recheck in an hour.  Christene Slates  11/13/2018  6:36 AM

## 2018-11-14 NOTE — Progress Notes (Signed)
[place] at [place] NAME: Dennis Kim    MR#:  [id number]  DATE OF BIRTH:  Aug 09, 1996  SUBJECTIVE:  Patient had Ambien last night and slept better.  Itching is improving slightly.   REVIEW OF SYSTEMS:  Review of Systems  Constitutional: Negative for chills, fever and malaise/fatigue.       Fevers appear resolved this morning.  HENT: Negative.  Negative for ear discharge, ear pain, hearing loss, nosebleeds, sore throat and tinnitus.   Eyes: Negative.  Negative for blurred vision, double vision and pain.  Respiratory: Negative.  Negative for cough, hemoptysis, shortness of breath and wheezing.   Cardiovascular: Negative.  Negative for chest pain, palpitations and leg swelling.  Gastrointestinal: Negative.  Negative for abdominal pain, blood in stool, diarrhea, heartburn, nausea and vomiting.  Genitourinary: Negative.  Negative for dysuria and urgency.  Musculoskeletal: Negative.  Negative for back pain, myalgias and neck pain.  Skin: Positive for itching and rash.  Neurological: Negative for dizziness, tingling, tremors, speech change, focal weakness, seizures and headaches.  Endo/Heme/Allergies: Negative.  Does not bruise/bleed easily.  Psychiatric/Behavioral: Negative.  Negative for depression, hallucinations, substance abuse and suicidal ideas.    DRUG ALLERGIES:   Allergies  Allergen Reactions  . Bactrim [Sulfamethoxazole-Trimethoprim]     [name] syndrome   VITALS:  Blood pressure 128/67, pulse 84, temperature 97.8 F (36.6 C), temperature source Oral, resp. rate 16, height 5\' 7"  (1.702 m), weight 70.8 kg, SpO2 100 %. PHYSICAL EXAMINATION:   Constitutional: He is oriented to person, place, and time. He appears well-developed and well-nourished. No distress.  HENT:  Head: Normocephalic and atraumatic.  No evidence of any ulcers in the oral mucosa. Eyes: Pupils are equal, round, and reactive to light. Conjunctivae and  EOM are normal.  Neck: Normal range of motion. Neck supple. No JVD present. No tracheal deviation present. No thyromegaly present.  Cardiovascular: Normal rate, regular rhythm and normal heart sounds. Exam reveals no gallop.  No murmur heard. Respiratory: Effort normal and breath sounds normal. No respiratory distress.  GI: Soft. Bowel sounds are normal. He exhibits no distension. There is no abdominal tenderness.  Musculoskeletal: Normal range of motion.        General: No edema.  Lymphadenopathy:    He has no cervical adenopathy.  Neurological: He is alert and oriented to person, place, and time. No cranial nerve deficit.  Skin:Non blanching widespread erythema and edema with vesicles on arms, trunk, and legs.  Purpuric patches on groin and lower extemities  Psychiatric: He has a normal mood and affect. His behavior is normal. Judgment and thought content normal.   LABORATORY PANEL:  Male CBC Recent Labs  Lab 11/12/18 0516  WBC 10.3  HGB 14.9  HCT 42.3  PLT 287   ------------------------------------------------------------------------------------------------------------------ Chemistries  Recent Labs  Lab 11/12/18 0516 11/13/18 0339  NA 138 140  K 3.2* 4.2  CL 104 110  CO2 22 24  GLUCOSE 198* 154*  BUN 7 9  CREATININE 1.00 0.79  CALCIUM 8.6* 7.9*  MG  --  1.8  AST 38  --   ALT 81*  --   ALKPHOS 84  --   BILITOT 0.6  --    RADIOLOGY:  No results found. ASSESSMENT AND PLAN:   This is a 23 year old male admitted for Carlyn Reichert syndrome.  1.    Presumed Stevens-Johnson syndrome from Bactrim:  Patient is status post biopsy 11/13/2018 by surgery and awaiting results. Dermatology  consultation appreciated and recommending transfer to [facility] given extensive erythema and vesiculation and sloughing of the skin which will require specialized skin care.. Continue symptom management with IV fluids, pain control and anti-itching.    Awaiting bed at [facility] for transfer  accepting physician is Dr. Hezzie Bump Patient currently does not have any evidence of sloughing of skin or mucosal involvement. Bactrim added to allergy list Sepsis has been ruled out with negative cultures and NO UTI/PNA STOP ABX  2.  Hypokalemia: Repleted   I left message with dermatologist.  Management plans discussed with the patient and family and they are in agreement.  CODE STATUS: Full Code  TOTAL TIME TAKING CARE OF THIS PATIENT: 24 minutes.   More than 50% of the time was spent in counseling/coordination of care: YES  POSSIBLE transfer to [facility] once bed available.   Susa Bones M.D on 11/14/2018 at 10:19 AM  Between 7am to 6pm - Pager - [phone]  After 6pm go to [email or web address] - [employer] [facility]  Office  [phone]  CC: Primary care physician; Ann Held, DO  Note: This dictation was prepared with Dragon dictation along with smaller phrase technology. Any transcriptional errors that result from this process are unintentional.

## 2018-11-15 NOTE — Progress Notes (Signed)
[place] at [place] NAME: Dennis Kim    MR#:  [id number]  DATE OF BIRTH:  03-09-1996  SUBJECTIVE:   Patient here due to suspected Stevens-Johnson syndrome secondary to exposure to Bactrim.  Patient has some vesiculations on her left upper extremity.  Denies any oral or mucosal involvement or any ocular involvement presently.  Afebrile, hemodynamically stable.  Awaiting transfer to [facility].  REVIEW OF SYSTEMS:    Review of Systems  Constitutional: Negative for chills and fever.  HENT: Negative for congestion and tinnitus.   Eyes: Negative for blurred vision and double vision.  Respiratory: Negative for cough, shortness of breath and wheezing.   Cardiovascular: Negative for chest pain, orthopnea and PND.  Gastrointestinal: Negative for abdominal pain, diarrhea, nausea and vomiting.  Genitourinary: Negative for dysuria and hematuria.  Skin: Positive for rash (Diffuse Erythematous rash on Legs and arms b/l with vesiculations on left upper ext.  ).  Neurological: Negative for dizziness, sensory change and focal weakness.  All other systems reviewed and are negative.   Nutrition: regular Tolerating Diet: Yes Tolerating PT: Ambulatory  DRUG ALLERGIES:   Allergies  Allergen Reactions  . Bactrim [Sulfamethoxazole-Trimethoprim]     [name] syndrome    VITALS:  Blood pressure 131/74, pulse 80, temperature 97.9 F (36.6 C), temperature source Oral, resp. rate 18, height 5\' 7"  (1.702 m), weight 70.8 kg, SpO2 100 %.  PHYSICAL EXAMINATION:   Physical Exam  GENERAL:  23 y.o.-year-old patient lying in bed in no acute distress.  EYES: Pupils equal, round, reactive to light and accommodation. No scleral icterus. Extraocular muscles intact.  HEENT: Head atraumatic, normocephalic. Oropharynx and nasopharynx clear.  NECK:  Supple, no jugular venous distention. No thyroid enlargement, no tenderness.  LUNGS: Normal breath sounds  bilaterally, no wheezing, rales, rhonchi. No use of accessory muscles of respiration.  CARDIOVASCULAR: S1, S2 normal. No murmurs, rubs, or gallops.  ABDOMEN: Soft, nontender, nondistended. Bowel sounds present. No organomegaly or mass.  EXTREMITIES: No cyanosis, clubbing or edema b/l.    NEUROLOGIC: Cranial nerves II through XII are intact. No focal Motor or sensory deficits b/l.   PSYCHIATRIC: The patient is alert and oriented x 3.  SKIN: Diffuse erythematous rash on the lower and upper extremities bilaterally.  Vesiculations on the left upper extremity.  Suspected to be Stevens-Johnson syndrome versus Dress syndrome.    LABORATORY PANEL:   CBC Recent Labs  Lab 11/12/18 0516  WBC 10.3  HGB 14.9  HCT 42.3  PLT 287   ------------------------------------------------------------------------------------------------------------------  Chemistries  Recent Labs  Lab 11/12/18 0516 11/13/18 0339  NA 138 140  K 3.2* 4.2  CL 104 110  CO2 22 24  GLUCOSE 198* 154*  BUN 7 9  CREATININE 1.00 0.79  CALCIUM 8.6* 7.9*  MG  --  1.8  AST 38  --   ALT 81*  --   ALKPHOS 84  --   BILITOT 0.6  --    ------------------------------------------------------------------------------------------------------------------  Cardiac Enzymes Recent Labs  Lab 11/12/18 0516  TROPONINI <0.03   ------------------------------------------------------------------------------------------------------------------  RADIOLOGY:  No results found.   ASSESSMENT AND PLAN:   23 yo male w/ no PMH who was admitted to hospital due to a diffuse rash suspected to be Stevens-Johnson syndrome  1.   Presumed Stevens-Johnson syndrome from Bactrim:  Patient is status post biopsy 11/13/2018 by surgery and awaiting results. Dermatology consultation appreciated and recommending transfer to [facility] given extensive erythema and vesiculation and sloughing  of the skin which will require specialized skin care. -Continue symptom  management with Benadryl, IV steroids and pain control.  No ocular or mucosal involvement presently. [facility] transfer center this morning and they are still awaiting a bed.  No evidence of superinfection or sepsis.  Continue supportive care.  Updated patient's father at bedside.  2. Hypokalemia: Repleted and resolved.       All the records are reviewed and case discussed with Care Management/Social Worker. Management plans discussed with the patient, family and they are in agreement.  CODE STATUS: Full code  DVT Prophylaxis: Lovenox  TOTAL TIME TAKING CARE OF THIS PATIENT: 30 minutes.   POSSIBLE D/C IN 1-2 DAYS, DEPENDING ON CLINICAL CONDITION.   Henreitta Leber M.D on 11/15/2018 at 11:48 AM  Between 7am to 6pm - Pager - [phone]  After 6pm go to [email or web address] - [employer] [facility]  Office  [phone]  CC: Primary care physician; Ann Held, DO

## 2018-11-16 LAB — SURGICAL PATHOLOGY

## 2018-11-16 LAB — SEDIMENTATION RATE: Sed Rate: 7 mm/hr (ref 0–15)

## 2018-11-16 MED ORDER — TRIAMCINOLONE ACETONIDE 0.1 % EX CREA: 1.0000 "application " | TOPICAL_CREAM | Freq: Two times a day (BID) | CUTANEOUS | 0 refills | Status: DC

## 2018-11-16 MED ORDER — PREDNISONE 20 MG PO TABS: 60.0000 mg | ORAL_TABLET | Freq: Every day | ORAL | 0 refills | Status: DC

## 2018-11-16 MED ORDER — METHYLPREDNISOLONE SODIUM SUCC 40 MG IJ SOLR: 40.0000 mg | Freq: Two times a day (BID) | INTRAMUSCULAR | Status: DC

## 2018-11-16 NOTE — Progress Notes (Signed)
Pt is being discharged to home. AVS and RX was given and explained to the pt and they each verbalized understanding of the information.

## 2018-11-16 NOTE — Discharge Summary (Signed)
[place] at [place] NAME: Dennis Kim    MR#:  [id number]  DATE OF BIRTH:  24-Sep-1996  DATE OF ADMISSION:  11/12/2018 ADMITTING PHYSICIAN: Harrie Foreman, MD  DATE OF DISCHARGE: 11/16/2018  PRIMARY CARE PHYSICIAN: Ann Held, DO    ADMISSION DIAGNOSIS:  Stevens-Johnson syndrome ([place]) [L51.1] Fever, unspecified fever cause [R50.9] Sepsis, due to unspecified organism, unspecified whether acute organ dysfunction present ([place]) [A41.9]  DISCHARGE DIAGNOSIS:  Active Problems:   Stevens-Johnson syndrome ([place])   SECONDARY DIAGNOSIS:   Past Medical History:  Diagnosis Date  . Asthma     HOSPITAL COURSE:   23 yo male w/ no PMH who was admitted to hospital due to a diffuse rash suspected to be Stevens-Johnson syndrome  1. Rash - initially suspected to be Stevens-Johnson syndrome and therefore was admitted to the hospital placed on IV steroids, supportive care with anti-itch medications.  Dermatology consult was obtained.  Initially a transfer to [facility] was initiated.  Patient has actually clinically improved.  Biopsy results obtained from pathology and as per them this is likely not Stevens-Johnson syndrome but likely a hypersensitive reaction to the Bactrim that the patient was taking. -Discussed with dermatology Dr. Kellie Moor who talked to the patient's father and also the patient and recommended discharge on high-dose oral steroids with triamcinolone cream and outpatient follow-up with her the next 2 days. -Patient's rash has significantly improved and his skin is starting to slough off on his backside, the erythema in his lower extremities has decreased, the vesiculations on his left upper extremities have also improved. -Patient has never had any mucosal or ocular involvement.    2. Hypokalemia: Repleted and resolved.  DISCHARGE CONDITIONS:   Stable.   CONSULTS OBTAINED:    DRUG ALLERGIES:   Allergies  Allergen  Reactions  . Bactrim [Sulfamethoxazole-Trimethoprim]     [name] syndrome    DISCHARGE MEDICATIONS:   Allergies as of 11/16/2018      Reactions   Bactrim [sulfamethoxazole-trimethoprim]    [name] syndrome      Medication List    STOP taking these medications   sulfamethoxazole-trimethoprim 800-160 MG tablet Commonly known as:  BACTRIM DS,SEPTRA DS     TAKE these medications   meloxicam 15 MG tablet Commonly known as:  MOBIC Take 1 tablet by mouth daily.   pantoprazole 40 MG tablet Commonly known as:  PROTONIX Take 1 tablet (40 mg total) by mouth daily.   predniSONE 20 MG tablet Commonly known as:  DELTASONE Take 3 tablets (60 mg total) by mouth daily with breakfast. What changed:    medication strength  how much to take  when to take this  additional instructions   PROAIR HFA 108 (90 Base) MCG/ACT inhaler Generic drug:  albuterol Inhale 2 puffs into the lungs every 4 (four) hours as needed for wheezing or shortness of breath.   triamcinolone cream 0.1 % Commonly known as:  KENALOG Apply 1 application topically 2 (two) times daily.         DISCHARGE INSTRUCTIONS:   DIET:  Regular diet  DISCHARGE CONDITION:  Stable  ACTIVITY:  Activity as tolerated  OXYGEN:  Home Oxygen: No.   Oxygen Delivery: room air  DISCHARGE LOCATION:  home   If you experience worsening of your admission symptoms, develop shortness of breath, life threatening emergency, suicidal or homicidal thoughts you must seek medical attention immediately by calling 911 or calling your MD immediately  if symptoms less  severe.  You Must read complete instructions/literature along with all the possible adverse reactions/side effects for all the Medicines you take and that have been prescribed to you. Take any new Medicines after you have completely understood and accpet all the possible adverse reactions/side effects.   Please note  You were cared for by a hospitalist  during your hospital stay. If you have any questions about your discharge medications or the care you received while you were in the hospital after you are discharged, you can call the unit and asked to speak with the hospitalist on call if the hospitalist that took care of you is not available. Once you are discharged, your primary care physician will handle any further medical issues. Please note that NO REFILLS for any discharge medications will be authorized once you are discharged, as it is imperative that you return to your primary care physician (or establish a relationship with a primary care physician if you do not have one) for your aftercare needs so that they can reassess your need for medications and monitor your lab values.    DATA REVIEW:   CBC Recent Labs  Lab 11/12/18 0516  WBC 10.3  HGB 14.9  HCT 42.3  PLT 287    Chemistries  Recent Labs  Lab 11/12/18 0516 11/13/18 0339  NA 138 140  K 3.2* 4.2  CL 104 110  CO2 22 24  GLUCOSE 198* 154*  BUN 7 9  CREATININE 1.00 0.79  CALCIUM 8.6* 7.9*  MG  --  1.8  AST 38  --   ALT 81*  --   ALKPHOS 84  --   BILITOT 0.6  --     Cardiac Enzymes Recent Labs  Lab 11/12/18 0516  TROPONINI <0.03    Microbiology Results  Results for orders placed or performed during the hospital encounter of 11/12/18  Urine culture     Status: None   Collection Time: 11/12/18  5:16 AM  Result Value Ref Range Status   Specimen Description URINE, RANDOM  Final   Special Requests   Final    NONE Performed at [facility], [address] [place], [place] [address]    Culture NO GROWTH  Final   Report Status 11/13/2018 FINAL  Final  Group A Strep by PCR     Status: None   Collection Time: 11/12/18  5:16 AM  Result Value Ref Range Status   Group A Strep by PCR NOT DETECTED NOT DETECTED Final    Comment: Performed at [facility], [address] [place], [place] [address]  Blood Culture (routine x 2)     Status:  None (Preliminary result)   Collection Time: 11/12/18  5:17 AM  Result Value Ref Range Status   Specimen Description BLOOD LEFT ANTECUBITAL  Final   Special Requests   Final    BOTTLES DRAWN AEROBIC AND ANAEROBIC Blood Culture adequate volume   Culture   Final    NO GROWTH 4 DAYS Performed at [facility], [address], [place], [place] [address]    Report Status PENDING  Incomplete  Blood Culture (routine x 2)     Status: None (Preliminary result)   Collection Time: 11/12/18  5:17 AM  Result Value Ref Range Status   Specimen Description BLOOD RIGHT ANTECUBITAL  Final   Special Requests   Final    BOTTLES DRAWN AEROBIC AND ANAEROBIC Blood Culture results may not be optimal due to an excessive volume of blood received  in culture bottles   Culture   Final    NO GROWTH 4 DAYS Performed at [facility], [address] [place], [place] [address]    Report Status PENDING  Incomplete    RADIOLOGY:  No results found.    Management plans discussed with the patient, family and they are in agreement.  CODE STATUS:     Code Status Orders  (From admission, onward)         Start     Ordered   11/12/18 0742  Full code  Continuous     11/12/18 0741        Code Status History    This patient has a current code status but no historical code status.      TOTAL TIME TAKING CARE OF THIS PATIENT: 40 minutes.    Henreitta Leber M.D on 11/16/2018 at 3:51 PM  Between 7am to 6pm - Pager - [phone]  After 6pm go to [email or web address] - [employer] [facility]  Office  [phone]  CC: Primary care physician; Ann Held, DO

## 2018-11-16 NOTE — Progress Notes (Signed)
[place] at [place] NAME: Dennis Kim    MR#:  [id number]  DATE OF BIRTH:  [birth year]  SUBJECTIVE:   Still has some vesicles and redness on the left forearm.  Patient is starting to slough off dead skin on his back.  Afebrile, hemodynamically stable.  REVIEW OF SYSTEMS:    Review of Systems  Constitutional: Negative for chills and fever.  HENT: Negative for congestion and tinnitus.   Eyes: Negative for blurred vision and double vision.  Respiratory: Negative for cough, shortness of breath and wheezing.   Cardiovascular: Negative for chest pain, orthopnea and PND.  Gastrointestinal: Negative for abdominal pain, diarrhea, nausea and vomiting.  Genitourinary: Negative for dysuria and hematuria.  Skin: Positive for rash (Diffuse Erythematous rash on Legs and arms b/l with vesiculations on left upper ext.  ).  Neurological: Negative for dizziness, sensory change and focal weakness.  All other systems reviewed and are negative.   Nutrition: regular Tolerating Diet: Yes Tolerating PT: Ambulatory  DRUG ALLERGIES:   Allergies  Allergen Reactions  . Bactrim [Sulfamethoxazole-Trimethoprim]     [name] syndrome    VITALS:  Blood pressure 128/68, pulse 79, temperature 98.1 F (36.7 C), temperature source Oral, resp. rate 19, height 5\' 7"  (1.702 m), weight 70.9 kg, SpO2 98 %.  PHYSICAL EXAMINATION:   Physical Exam  GENERAL:  23 y.o.-year-old patient lying in bed in no acute distress.  EYES: Pupils equal, round, reactive to light and accommodation. No scleral icterus. Extraocular muscles intact.  HEENT: Head atraumatic, normocephalic. Oropharynx and nasopharynx clear.  NECK:  Supple, no jugular venous distention. No thyroid enlargement, no tenderness.  LUNGS: Normal breath sounds bilaterally, no wheezing, rales, rhonchi. No use of accessory muscles of respiration.  CARDIOVASCULAR: S1, S2 normal. No murmurs, rubs, or gallops.    ABDOMEN: Soft, nontender, nondistended. Bowel sounds present. No organomegaly or mass.  EXTREMITIES: No cyanosis, clubbing or edema b/l.    NEUROLOGIC: Cranial nerves II through XII are intact. No focal Motor or sensory deficits b/l.   PSYCHIATRIC: The patient is alert and oriented x 3.  SKIN: Diffuse erythematous rash on the lower and upper extremities bilaterally.  Vesiculations on the left upper extremity.  Dry skin and scaling off on the back.   LABORATORY PANEL:   CBC Recent Labs  Lab 11/12/18 0516  WBC 10.3  HGB 14.9  HCT 42.3  PLT 287   ------------------------------------------------------------------------------------------------------------------  Chemistries  Recent Labs  Lab 11/12/18 0516 11/13/18 0339  NA 138 140  K 3.2* 4.2  CL 104 110  CO2 22 24  GLUCOSE 198* 154*  BUN 7 9  CREATININE 1.00 0.79  CALCIUM 8.6* 7.9*  MG  --  1.8  AST 38  --   ALT 81*  --   ALKPHOS 84  --   BILITOT 0.6  --    ------------------------------------------------------------------------------------------------------------------  Cardiac Enzymes Recent Labs  Lab 11/12/18 0516  TROPONINI <0.03   ------------------------------------------------------------------------------------------------------------------  RADIOLOGY:  No results found.   ASSESSMENT AND PLAN:   23 yo male w/ no PMH who was admitted to hospital due to a diffuse rash suspected to be Stevens-Johnson syndrome  1.  Rash - suspected to be Remo Lipps Johnson's (vs) DRESS.  - Discussed w/ pathology and they think it's likely a drug reaction rather than Stevens-Johnson syndrome. - Also discussed this with the dermatologist Dr. Darrick Huntsman over the phone. - Continue steroids, Benadryl, supportive care for now. -Awaiting transfer to [facility]  but likely does not need this anymore as patient's rash is likely a drug reaction rather than Stevens-Johnson syndrome. -Patient has no ocular or mucosal involvement.  2.  Hypokalemia: Repleted and resolved.    All the records are reviewed and case discussed with Care Management/Social Worker. Management plans discussed with the patient, family and they are in agreement.  CODE STATUS: Full code  DVT Prophylaxis: Lovenox  TOTAL TIME TAKING CARE OF THIS PATIENT: 30 minutes.   POSSIBLE D/C today after dermatology discusses with pt's family, DEPENDING ON CLINICAL CONDITION.   Henreitta Leber M.D on 11/16/2018 at 1:53 PM  Between 7am to 6pm - Pager - [phone]  After 6pm go to [email or web address] - Technical brewer [facility]  Office  [phone]  CC: Primary care physician; Ann Held, DO

## 2018-11-16 NOTE — Progress Notes (Signed)
[facility]    Dennis Kim was admitted to the Hospital on 11/12/2018 and Discharged  11/16/2018 and should be excused from work/school   for 14 days starting 11/12/2018 , may return to work/school without any restrictions.  Call Abel Presto MD, [facility]  [phone] with questions.  Henreitta Leber M.D on 11/16/2018,at 2:46 PM

## 2018-11-17 DIAGNOSIS — L309 Dermatitis, unspecified: Secondary | ICD-10-CM | POA: Diagnosis not present

## 2018-11-17 DIAGNOSIS — L270 Generalized skin eruption due to drugs and medicaments taken internally: Secondary | ICD-10-CM | POA: Diagnosis not present

## 2018-11-17 LAB — CULTURE, BLOOD (ROUTINE X 2)
Culture: NO GROWTH
Culture: NO GROWTH
Special Requests: ADEQUATE

## 2018-11-17 LAB — HIV ANTIBODY (ROUTINE TESTING W REFLEX): HIV Screen 4th Generation wRfx: NONREACTIVE

## 2018-11-19 ENCOUNTER — Telehealth: Payer: Self-pay | Admitting: *Deleted

## 2018-11-19 NOTE — Telephone Encounter (Signed)
Received Hospital Discharge Notification from [facility]; forwarded to provider/SLS 01/31

## 2018-11-23 DIAGNOSIS — N5201 Erectile dysfunction due to arterial insufficiency: Secondary | ICD-10-CM | POA: Diagnosis not present

## 2018-11-23 DIAGNOSIS — N410 Acute prostatitis: Secondary | ICD-10-CM | POA: Diagnosis not present

## 2018-11-24 DIAGNOSIS — T370X5D Adverse effect of sulfonamides, subsequent encounter: Secondary | ICD-10-CM | POA: Diagnosis not present

## 2018-11-24 DIAGNOSIS — L309 Dermatitis, unspecified: Secondary | ICD-10-CM | POA: Diagnosis not present

## 2019-06-09 ENCOUNTER — Telehealth: Payer: Self-pay | Admitting: Cardiology

## 2019-06-09 NOTE — Telephone Encounter (Signed)
LVM for patient to call to schedule 6 month f/u from recall

## 2019-08-01 ENCOUNTER — Ambulatory Visit (INDEPENDENT_AMBULATORY_CARE_PROVIDER_SITE_OTHER): Payer: BC Managed Care – PPO | Admitting: Cardiology

## 2019-08-01 ENCOUNTER — Encounter: Payer: Self-pay | Admitting: Cardiology

## 2019-08-01 ENCOUNTER — Other Ambulatory Visit: Payer: Self-pay

## 2019-08-01 VITALS — BP 126/72 | HR 93 | Ht 67.0 in | Wt 153.8 lb

## 2019-08-01 DIAGNOSIS — R0600 Dyspnea, unspecified: Secondary | ICD-10-CM | POA: Diagnosis not present

## 2019-08-01 DIAGNOSIS — J4530 Mild persistent asthma, uncomplicated: Secondary | ICD-10-CM | POA: Diagnosis not present

## 2019-08-01 DIAGNOSIS — R0609 Other forms of dyspnea: Secondary | ICD-10-CM

## 2019-08-01 DIAGNOSIS — I422 Other hypertrophic cardiomyopathy: Secondary | ICD-10-CM | POA: Diagnosis not present

## 2019-08-01 NOTE — Addendum Note (Signed)
Addended by: Ashok Norris on: 08/01/2019 02:40 PM   Modules accepted: Orders

## 2019-08-01 NOTE — Progress Notes (Signed)
Cardiology Office Note:    Date:  08/01/2019   ID:  Dennis Kim, DOB 1996/08/17, MRN [id number]  PCP:  Dennis Kim, Dennis Apa, DO  Cardiologist:  Dennis Campus, MD    Referring MD: Dennis Kim, Dennis Kim, *   Chief Complaint  Patient presents with  . Follow-up  Doing very well  History of Present Illness:    Dennis Kim is a 23 y.o. male with hypertrophic cardiomyopathy with minimal draining, no arrhythmia.  Comes today to my office for follow-up overall doing great denies having any chest pain, tightness, pressure, burning in the chest.  No exertional shortness of breath no dizziness no palpitation no passing out.  Past Medical History:  Diagnosis Date  . Asthma     Past Surgical History:  Procedure Laterality Date  . NO PAST SURGERIES      Current Medications: Current Meds  Medication Sig  . PROAIR HFA 108 (90 Base) MCG/ACT inhaler Inhale 2 puffs into the lungs every 4 (four) hours as needed for wheezing or shortness of breath.     Allergies:   Bactrim [sulfamethoxazole-trimethoprim]   Social History   Socioeconomic History  . Marital status: Single    Spouse name: Not on file  . Number of children: Not on file  . Years of education: Not on file  . Highest education level: Not on file  Occupational History    Comment: student [employer]  Social Needs  . Financial resource strain: Not on file  . Food insecurity    Worry: Not on file    Inability: Not on file  . Transportation needs    Medical: Not on file    Non-medical: Not on file  Tobacco Use  . Smoking status: Never Smoker  . Smokeless tobacco: Never Used  Substance and Sexual Activity  . Alcohol use: Yes    Comment: rare  . Drug use: No  . Sexual activity: Never    Partners: Female  Lifestyle  . Physical activity    Days per week: Not on file    Minutes per session: Not on file  . Stress: Not on file  Relationships  . Social Herbalist on phone: Not on file    Gets  together: Not on file    Attends religious service: Not on file    Active member of club or organization: Not on file    Attends meetings of clubs or organizations: Not on file    Relationship status: Not on file  Other Topics Concern  . Not on file  Social History Narrative  . Not on file     Family History: The patient's family history includes Brain cancer in his maternal grandmother; Breast cancer in his paternal grandmother; Kidney disease in his paternal uncle; Thyroid disease in his father. ROS:   Please see the history of present illness.    All 14 point review of systems negative except as described per history of present illness  EKGs/Labs/Other Studies Reviewed:      Recent Labs: 11/12/2018: ALT 81; Hemoglobin 14.9; Platelets 287; TSH 5.250 11/13/2018: BUN 9; Creatinine, Ser 0.79; Magnesium 1.8; Potassium 4.2; Sodium 140  Recent Lipid Panel    Component Value Date/Time   CHOL 181 05/21/2016 0802   TRIG 144.0 05/21/2016 0802   HDL 45.10 05/21/2016 0802   CHOLHDL 4 05/21/2016 0802   VLDL 28.8 05/21/2016 0802   LDLCALC 107 (H) 05/21/2016 0802    Physical Exam:  VS:  BP 126/72   Pulse 93   Ht 5\' 7"  (1.702 m)   Wt 153 lb 12.8 oz (69.8 kg)   SpO2 98%   BMI 24.09 kg/m     Wt Readings from Last 3 Encounters:  08/01/19 153 lb 12.8 oz (69.8 kg)  11/16/18 156 lb 4.9 oz (70.9 kg)  11/10/18 150 lb (68 kg)     GEN:  Well nourished, well developed in no acute distress HEENT: Normal NECK: No JVD; No carotid bruits LYMPHATICS: No lymphadenopathy CARDIAC: RRR, no murmurs, no rubs, no gallops RESPIRATORY:  Clear to auscultation without rales, wheezing or rhonchi  ABDOMEN: Soft, non-tender, non-distended MUSCULOSKELETAL:  No edema; No deformity  SKIN: Warm and dry LOWER EXTREMITIES: no swelling NEUROLOGIC:  Alert and oriented x 3 PSYCHIATRIC:  Normal affect   ASSESSMENT:    1. Hypertrophic cardiomyopathy ([place])   2. Mild persistent asthma without complication    3. Dyspnea on exertion    PLAN:    In order of problems listed above:  1. Hypertrophic cardiomyopathy time to repeat echocardiogram.  Likely he is completely asymptomatic he does not have any dangerous features of his problem.  Does not exercise aggressively.  I will do EKG as well as echocardiogram. 2. Mild asthma denies having any 3. Dyspnea on exertion denies having any   Medication Adjustments/Labs and Tests Ordered: Current medicines are reviewed at length with the patient today.  Concerns regarding medicines are outlined above.  No orders of the defined types were placed in this encounter.  Medication changes: No orders of the defined types were placed in this encounter.   Signed, Dennis Liter, MD, [facility] 08/01/2019 2:27 PM    [place]

## 2019-08-01 NOTE — Patient Instructions (Signed)
Medication Instructions:  Your physician recommends that you continue on your current medications as directed. Please refer to the Current Medication list given to you today.  If you need a refill on your cardiac medications before your next appointment, please call your pharmacy.   Lab work: None.  If you have labs (blood work) drawn today and your tests are completely normal, you will receive your results only by: Marland Kitchen MyChart Message (if you have MyChart) OR . A paper copy in the mail If you have any lab test that is abnormal or we need to change your treatment, we will call you to review the results.  Testing/Procedures: Your physician has requested that you have an echocardiogram. Echocardiography is a painless test that uses sound waves to create images of your heart. It provides your doctor with information about the size and shape of your heart and how well your heart's chambers and valves are working. This procedure takes approximately one hour. There are no restrictions for this procedure.    Follow-Up: At [facility], you and your health needs are our priority.  As part of our continuing mission to provide you with exceptional heart care, we have created designated Provider Care Teams.  These Care Teams include your primary Cardiologist (physician) and Advanced Practice Providers (APPs -  Physician Assistants and Nurse Practitioners) who all work together to provide you with the care you need, when you need it. You will need a follow up appointment in 1 years.  Please call our office 2 months in advance to schedule this appointment.  You may see Jenne Campus, MD or another member of our [place] Provider Team in [place]: Shirlee More, MD . Jyl Heinz, MD  Any Other Special Instructions Will Be Listed Below (If Applicable).   Echocardiogram An echocardiogram is a procedure that uses painless sound waves (ultrasound) to produce an image of the heart. Images from  an echocardiogram can provide important information about:  Signs of coronary artery disease (CAD).  Aneurysm detection. An aneurysm is a weak or damaged part of an artery wall that bulges out from the normal force of blood pumping through the body.  Heart size and shape. Changes in the size or shape of the heart can be associated with certain conditions, including heart failure, aneurysm, and CAD.  Heart muscle function.  Heart valve function.  Signs of a past heart attack.  Fluid buildup around the heart.  Thickening of the heart muscle.  A tumor or infectious growth around the heart valves. Tell a health care provider about:  Any allergies you have.  All medicines you are taking, including vitamins, herbs, eye drops, creams, and over-the-counter medicines.  Any blood disorders you have.  Any surgeries you have had.  Any medical conditions you have.  Whether you are pregnant or may be pregnant. What are the risks? Generally, this is a safe procedure. However, problems may occur, including:  Allergic reaction to dye (contrast) that may be used during the procedure. What happens before the procedure? No specific preparation is needed. You may eat and drink normally. What happens during the procedure?   An IV tube may be inserted into one of your veins.  You may receive contrast through this tube. A contrast is an injection that improves the quality of the pictures from your heart.  A gel will be applied to your chest.  A wand-like tool (transducer) will be moved over your chest. The gel will help to transmit the  sound waves from the transducer.  The sound waves will harmlessly bounce off of your heart to allow the heart images to be captured in real-time motion. The images will be recorded on a computer. The procedure may vary among health care providers and hospitals. What happens after the procedure?  You may return to your normal, everyday life, including diet,  activities, and medicines, unless your health care provider tells you not to do that. Summary  An echocardiogram is a procedure that uses painless sound waves (ultrasound) to produce an image of the heart.  Images from an echocardiogram can provide important information about the size and shape of your heart, heart muscle function, heart valve function, and fluid buildup around your heart.  You do not need to do anything to prepare before this procedure. You may eat and drink normally.  After the echocardiogram is completed, you may return to your normal, everyday life, unless your health care provider tells you not to do that. This information is not intended to replace advice given to you by your health care provider. Make sure you discuss any questions you have with your health care provider. Document Released: 10/03/2000 Document Revised: 01/27/2019 Document Reviewed: 11/08/2016 Elsevier Patient Education  2020 [employer].

## 2019-08-03 ENCOUNTER — Ambulatory Visit (HOSPITAL_BASED_OUTPATIENT_CLINIC_OR_DEPARTMENT_OTHER)
Admission: RE | Admit: 2019-08-03 | Discharge: 2019-08-03 | Disposition: A | Discharge status: Home / Self Care | Payer: BC Managed Care – PPO | Source: Ambulatory Visit | Attending: Cardiology | Admitting: Cardiology

## 2019-08-03 DIAGNOSIS — R0600 Dyspnea, unspecified: Secondary | ICD-10-CM

## 2019-08-03 DIAGNOSIS — I422 Other hypertrophic cardiomyopathy: Secondary | ICD-10-CM | POA: Diagnosis not present

## 2019-08-03 NOTE — Progress Notes (Signed)
  Echocardiogram 2D Echocardiogram has been performed.  Dennis Kim 08/03/2019, 3:10 PM

## 2019-08-05 ENCOUNTER — Telehealth: Payer: Self-pay | Admitting: Emergency Medicine

## 2019-08-05 NOTE — Telephone Encounter (Signed)
Left message for patient to return call regarding results  

## 2019-10-21 HISTORY — PX: WISDOM TOOTH EXTRACTION: SHX21

## 2020-03-18 IMAGING — DX DG CHEST 2V
2 series · 2 of 2 positions shown · non-contrast
Comparison: 07/06/2008

CLINICAL DATA: Chest tightness and pain with shortness of breath

EXAM:
CHEST - 2 VIEW

[chest pa]
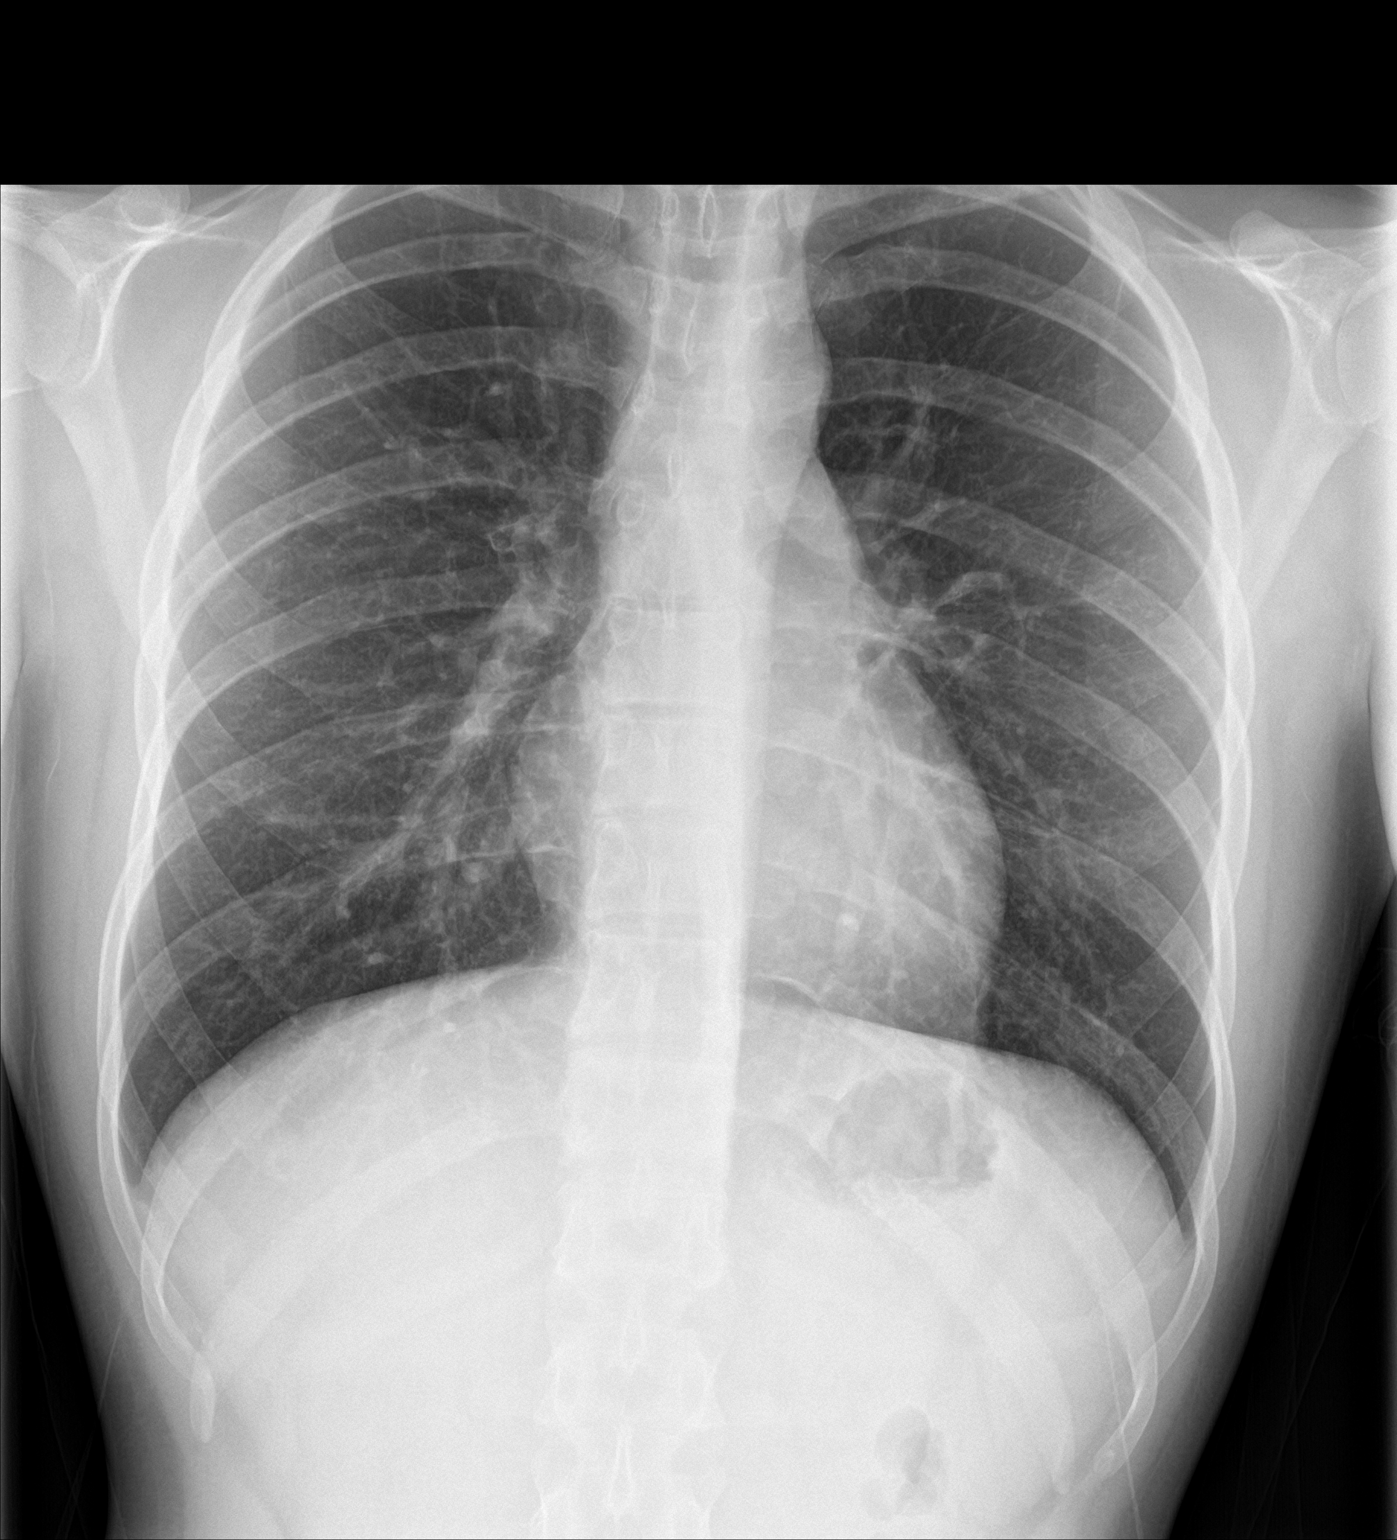

[chest lat]
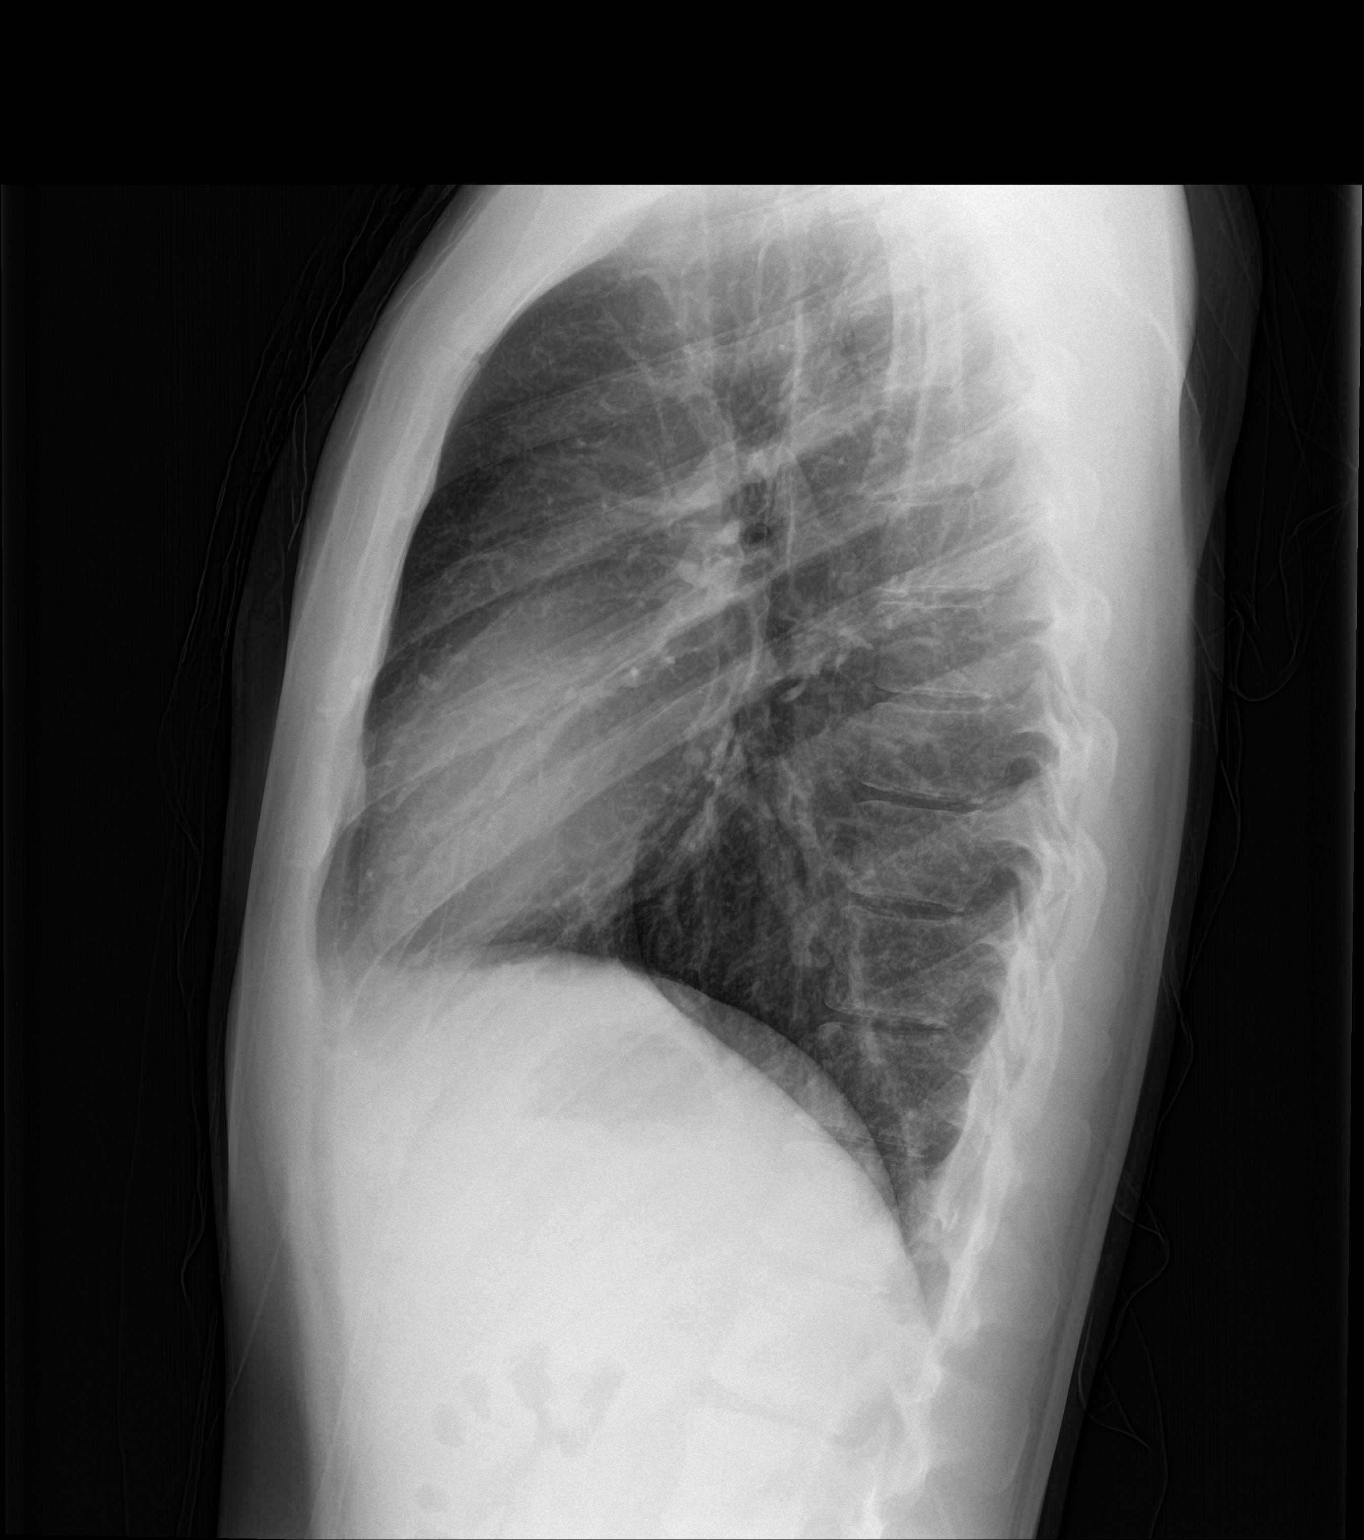

[2 of 2 positions shown; findings below may reference images not displayed]

FINDINGS: The heart size and mediastinal contours are within normal limits.
Both lungs are clear. The visualized skeletal structures are
unremarkable.
IMPRESSION: No active cardiopulmonary disease.

## 2020-06-12 DIAGNOSIS — Z20828 Contact with and (suspected) exposure to other viral communicable diseases: Secondary | ICD-10-CM | POA: Diagnosis not present

## 2020-07-04 DIAGNOSIS — Z20828 Contact with and (suspected) exposure to other viral communicable diseases: Secondary | ICD-10-CM | POA: Diagnosis not present

## 2020-07-30 ENCOUNTER — Inpatient Hospital Stay: Payer: BC Managed Care – PPO | Attending: Genetic Counselor | Admitting: Genetic Counselor

## 2020-07-30 ENCOUNTER — Inpatient Hospital Stay: Payer: BC Managed Care – PPO

## 2020-07-30 ENCOUNTER — Encounter: Payer: Self-pay | Admitting: Genetic Counselor

## 2020-07-30 ENCOUNTER — Other Ambulatory Visit: Payer: Self-pay

## 2020-07-30 DIAGNOSIS — Z808 Family history of malignant neoplasm of other organs or systems: Secondary | ICD-10-CM | POA: Diagnosis not present

## 2020-07-30 DIAGNOSIS — I422 Other hypertrophic cardiomyopathy: Secondary | ICD-10-CM

## 2020-07-30 DIAGNOSIS — Z8049 Family history of malignant neoplasm of other genital organs: Secondary | ICD-10-CM | POA: Insufficient documentation

## 2020-07-30 DIAGNOSIS — Z803 Family history of malignant neoplasm of breast: Secondary | ICD-10-CM | POA: Diagnosis not present

## 2020-07-30 DIAGNOSIS — Z809 Family history of malignant neoplasm, unspecified: Secondary | ICD-10-CM | POA: Diagnosis not present

## 2020-07-30 DIAGNOSIS — Z800 Family history of malignant neoplasm of digestive organs: Secondary | ICD-10-CM | POA: Diagnosis not present

## 2020-07-30 NOTE — Progress Notes (Signed)
REFERRING PROVIDER: Ann Held, DO [address] [place],  [place] [address]  PRIMARY PROVIDER:  Carollee Herter, Alferd Apa, DO  PRIMARY REASON FOR VISIT:  1. Hypertrophic cardiomyopathy ([place])   2. Family history of breast cancer   3. Family history of colon cancer   4. Family history of melanoma   5. Family history of uterine cancer      HISTORY OF PRESENT ILLNESS:   Dennis Kim, a 24 y.o. male, was seen for a [facility] cancer genetics consultation at the request of Dr. Carollee Herter due to a family history of breast and other cancers.  Dennis Kim presents to clinic today to discuss the possibility of a hereditary predisposition to cancer, genetic testing, and to further clarify his future cancer risks, as well as potential cancer risks for family members.   Dennis Kim is a 24 y.o. male with no personal history of cancer. Two years ago he was worked up for chest pain and was found to have hypertrophic cardiomyopathy.  He has not undergone genetic testing for this condition. His mother was found to have a POT1 mutation and his paternal grandmother has an ATM pathogenic mutation.  CANCER HISTORY:  Oncology History   No history exists.     Past Medical History:  Diagnosis Date  . Asthma   . Family history of breast cancer   . Family history of colon cancer   . Family history of melanoma   . Family history of uterine cancer     Past Surgical History:  Procedure Laterality Date  . NO PAST SURGERIES      Social History   Socioeconomic History  . Marital status: Single    Spouse name: Not on file  . Number of children: Not on file  . Years of education: Not on file  . Highest education level: Not on file  Occupational History    Comment: student [employer]  Tobacco Use  . Smoking status: Never Smoker  . Smokeless tobacco: Never Used  Vaping Use  . Vaping Use: Never used  Substance and Sexual Activity  . Alcohol use: Yes    Comment: rare  . Drug use:  No  . Sexual activity: Never    Partners: Female  Other Topics Concern  . Not on file  Social History Narrative  . Not on file   Social Determinants of Health   Financial Resource Strain:   . Difficulty of Paying Living Expenses: Not on file  Food Insecurity:   . Worried About Charity fundraiser in the Last Year: Not on file  . Ran Out of Food in the Last Year: Not on file  Transportation Needs:   . Lack of Transportation (Medical): Not on file  . Lack of Transportation (Non-Medical): Not on file  Physical Activity:   . Days of Exercise per Week: Not on file  . Minutes of Exercise per Session: Not on file  Stress:   . Feeling of Stress : Not on file  Social Connections:   . Frequency of Communication with Friends and Family: Not on file  . Frequency of Social Gatherings with Friends and Family: Not on file  . Attends Religious Services: Not on file  . Active Member of Clubs or Organizations: Not on file  . Attends Archivist Meetings: Not on file  . Marital Status: Not on file     FAMILY HISTORY:  We obtained a detailed, 4-generation family history.  Significant diagnoses are listed below: Family History  Problem Relation Age of Onset  . Brain cancer Maternal Grandmother 44       glioblastoma\  . Thyroid disease Father   . Breast cancer Mother 33       POT+  . Melanoma Mother   . Breast cancer Paternal Grandmother   . Kidney disease Paternal Uncle   . Hypertrophic cardiomyopathy Paternal Uncle 26  . Breast cancer Maternal Grandfather 7       ATM+  . Colon cancer Paternal Grandfather 74  . Uterine cancer Other 39       PGF's sister    The patient does not have children.  He has an 67 year old sister who is cancer free.  His parents are both living.    His mother had breast cancer at age 33.  She underwent genetic testing and was found to have a POT1 pathogenic mutation.  She has three paternal half sisters and a maternal half brother and sister who are  all cancer free.  Her mother died of a glioblastoma and her father is living. His sister and mother both had breast cancer.  The patient's father has thyroid disease.  He has two brothers, one who died of kidney disease at 74 and was found on autopsy to have HCM.  The paternal grandmother had breast cancer.  Her sister had bladder cancer and her mother and a maternal aunt and paternal grandmother had breast cancer.  She tested positive for an ATM pathogenic mutation.  The grandfather had colon cancer in his mid 91's and his sister had uterine cancer.  Dennis Kim is aware of previous family history of genetic testing for hereditary cancer risks. Patient's maternal ancestors are of [place] European descent, and paternal ancestors are of [place], [place], [place] and [place] descent. There is possible reported Ashkenazi Jewish ancestry. There is no known consanguinity.  GENETIC COUNSELING ASSESSMENT: Dennis Kim is a 24 y.o. male with a family history of cancer which is somewhat suggestive of a hereditary cancer syndrome and predisposition to cancer given the family history of cancer and known familial mutations in POT1 and ATM. We, therefore, discussed and recommended the following at today's visit.   DISCUSSION: We discussed that 5 - 10% of breast cancer is hereditary, with most cases associated with BRCA mutations.  There are other genes that can be associated with hereditary breast cancer syndromes.  These include ATM, CHEK2 and PALB2.  The patient's mother underwent genetic testing based on her personal and family history of breast cancer.  At the time, genetic testing found three VUS - one in POT1, NBN and AXIN2.  The POT1 was upgraded to pathogenic.  This increases the risk for melanoma and brain cancer.  The patient has a 50% chance of testing positive for this mutation.  In taking the family history, the patient's mother mentioned that his paternal grandmother had breast cancer and underwent  genetic testing which found an ATM pathogenic mutation. We discussed that he has a 25% chance of testing positive for this pathogenic mutation. We discussed that if his testing is negative, his father should still undergo genetic testing.  However, if he is positive, then we have essentially tested his father and know that he also has this pathogenic mutation.    Looking at the family history of colon and young uterine cancer, we also reviewed the risks for Lynch syndrome.  While the risk is lower for testing positive for this condition than for  ATM or POT1, he meets the medical criteria for genetic testing based on the young age of uterine cancer in his paternal great aunt.  Lastly, the patient was diagnosed with hypertrophic cardiomyopathy (HCM) at age 60 and his uncle was found to have HCM on an autopsy. HCM can be caused by different conditions and can have different outcomes.  Genetic testing can let us know if this is a recessive or dominant condition, and if we need to screen and manage his care for anything other than HCM.  He is interested in pursuing genetic testing for HCM.  We will refer him to [facility] to see Lattie Corns for genetic counseling and testing.  We discussed that testing is beneficial, regardless whether it is for cancer or cardiac genetic syndromes, for several reasons including knowing how to follow individuals and understand if other family members could be at risk for cancer and allow them to undergo genetic testing.   We reviewed the characteristics, features and inheritance patterns of hereditary cancer syndromes. We also discussed genetic testing, including the appropriate family members to test, the process of testing, insurance coverage and turn-around-time for results. We discussed the implications of a negative, positive, carrier and/or variant of uncertain significant result. We recommended Dennis Kim pursue genetic testing for the multi-cancer gene panel with  RNA.  The Multi-Gene Panel offered by [facility] includes sequencing and/or deletion duplication testing of the following 85 genes: AIP, ALK, APC, ATM, AXIN2,BAP1,  BARD1, BLM, BMPR1A, BRCA1, BRCA2, BRIP1, CASR, CDC73, CDH1, CDK4, CDKN1B, CDKN1C, CDKN2A (p14ARF), CDKN2A (p16INK4a), CEBPA, CHEK2, CTNNA1, DICER1, DIS3L2, EGFR (c.2369C>T, p.Thr790Met variant only), EPCAM (Deletion/duplication testing only), FH, FLCN, GATA2, GPC3, GREM1 (Promoter region deletion/duplication testing only), HOXB13 (c.251G>A, p.Gly84Glu), HRAS, KIT, MAX, MEN1, MET, MITF (c.952G>A, p.Glu318Lys variant only), MLH1, MSH2, MSH3, MSH6, MUTYH, NBN, NF1, NF2, NTHL1, PALB2, PDGFRA, PHOX2B, PMS2, POLD1, POLE, POT1, PRKAR1A, PTCH1, PTEN, RAD50, RAD51C, RAD51D, RB1, RECQL4, RET, RNF43, RUNX1, SDHAF2, SDHA (sequence changes only), SDHB, SDHC, SDHD, SMAD4, SMARCA4, SMARCB1, SMARCE1, STK11, SUFU, TERC, TERT, TMEM127, TP53, TSC1, TSC2, VHL, WRN and WT1.    We discussed that some people do not want to undergo genetic testing due to fear of genetic discrimination.  A federal law called the Genetic Information Non-Discrimination Act (GINA) of 2008 helps protect individuals against genetic discrimination based on their genetic test results.  It impacts both health insurance and employment.  With health insurance, it protects against increased premiums, being kicked off insurance or being forced to take a test in order to be insured.  For employment it protects against hiring, firing and promoting decisions based on genetic test results.  Health status due to a cancer diagnosis is not protected under GINA.   Based on Mr. Consalvo family history of cancer, he meets medical criteria for genetic testing. Despite that he meets criteria, he may still have an out of pocket cost. We discussed that if his out of pocket cost for testing is over $100, the laboratory will call and confirm whether he wants to proceed with testing.  If the out of pocket cost of testing  is less than $100 he will be billed by the genetic testing laboratory.   PLAN: After considering the risks, benefits, and limitations, Dennis Kim provided informed consent to pursue genetic testing and the blood sample was sent to [facility] for analysis of the multi cancer gene panel. Results should be available within approximately 2-3 weeks' time, at which point they will be disclosed by telephone to  Dennis Kim, as will any additional recommendations warranted by these results. Dennis Kim will receive a summary of his genetic counseling visit and a copy of his results once available. This information will also be available in Epic.   Lastly, we encouraged Dennis Kim to remain in contact with cancer genetics annually so that we can continuously update the family history and inform him of any changes in cancer genetics and testing that may be of benefit for this family.   Mr. Rieves questions were answered to his satisfaction today. Our contact information was provided should additional questions or concerns arise. Thank you for the referral and allowing [place] to share in the care of your patient.   Mayuri Staples P. Florene Glen, [place], [facility] Licensed, Insurance risk surveyor [email or web address] phone: [phone]  The patient was seen for a total of 60 minutes in face-to-face genetic counseling.  This patient was discussed with Drs. Magrinat, Lindi Adie and/or Burr Medico who agrees with the above.    _______________________________________________________________________ For Office Staff:  Number of people involved in session: 2 Was an Intern/ student involved with case: no

## 2020-08-16 ENCOUNTER — Encounter: Payer: Self-pay | Admitting: Genetic Counselor

## 2020-08-16 ENCOUNTER — Telehealth: Payer: Self-pay | Admitting: Genetic Counselor

## 2020-08-16 ENCOUNTER — Ambulatory Visit: Payer: Self-pay | Admitting: Genetic Counselor

## 2020-08-16 DIAGNOSIS — Z1379 Encounter for other screening for genetic and chromosomal anomalies: Secondary | ICD-10-CM | POA: Insufficient documentation

## 2020-08-16 NOTE — Telephone Encounter (Signed)
Revealed negative genetic testing for the known hereditary mutations in POT1 and ATM.  He has a VUS in [place].  This will not change medical management.  Patient reports that he recevied a call about cardiology genetic testing that he needs to return, but he will look further into the hypertrophic cardiomyopathy.

## 2020-08-16 NOTE — Telephone Encounter (Signed)
LM on VM that results are back and to please call. 

## 2020-08-16 NOTE — Progress Notes (Signed)
HPI:  [name] was previously seen in the [facility] due to a family history of cancer and concerns regarding a hereditary predisposition to cancer. Please refer to our prior cancer genetics clinic note for more information regarding our discussion, assessment and recommendations, at the time. [name] recent genetic test results were disclosed to him, as were recommendations warranted by these results. These results and recommendations are discussed in more detail below.  CANCER HISTORY:  Oncology History   No history exists.    FAMILY HISTORY:  We obtained a detailed, 4-generation family history.  Significant diagnoses are listed below: Family History  Problem Relation Age of Onset   Brain cancer Maternal Grandmother 14       glioblastoma\   Thyroid disease Father    Breast cancer Mother 41       POT+   Melanoma Mother    Breast cancer Paternal Grandmother    Kidney disease Paternal Uncle    Hypertrophic cardiomyopathy Paternal Uncle 32   Breast cancer Maternal Grandfather 60       ATM+   Colon cancer Paternal Grandfather 47   Uterine cancer Other 36       PGF's sister    The patient does not have children.  He has an 70 year old sister who is cancer free.  His parents are both living.    His mother had breast cancer at age 81.  She underwent genetic testing and was found to have a POT1 pathogenic mutation.  She has three paternal half sisters and a maternal half brother and sister who are all cancer free.  Her mother died of a glioblastoma and her father is living. His sister and mother both had breast cancer.  The patient's father has thyroid disease.  He has two brothers, one who died of kidney disease at 25 and was found on autopsy to have HCM.  The paternal grandmother had breast cancer.  Her sister had bladder cancer and her mother and a maternal aunt and paternal grandmother had breast cancer.  She tested positive for an ATM pathogenic  mutation.  The grandfather had colon cancer in his mid 56's and his sister had uterine cancer.  [name] is aware of previous family history of genetic testing for hereditary cancer risks. Patient's maternal ancestors are of [place] European descent, and paternal ancestors are of [place], [place], [place] and [place] descent. There is possible reported Ashkenazi Jewish ancestry. There is no known consanguinity.     GENETIC TEST RESULTS: We recommended [name] pursue testing for the familial hereditary cancer gene mutation called POT1 called c.147del (p.Ile49Metfs*7) and ATM called c.7271T>G (p.Val2424Gly). We ordered the Multi-cancer panel in order to capture both of these familial mutations. [name] test was normal and did not reveal either familial mutation. We call this result a true negative result because the cancer-causing mutation was identified in [name] family, and he did not inherit it.  Given this negative result, [name] chances of developing POT1-related cancers or ATM-related cancers are the same as they are in the general population.  The test report has been scanned into EPIC and is located under the Molecular Pathology section of the Results Review tab.  A portion of the result report is included below for reference.     We discussed with [name] that because current genetic testing is not perfect, it is possible there may be a gene mutation in one of these genes that current testing  cannot detect, but that chance is small.  We also discussed, that there could be another gene that has not yet been discovered, or that we have not yet tested, that is responsible for the cancer diagnoses in the family. It is also possible there is a hereditary cause for the cancer in the family that [name] did not inherit and therefore was not identified in his testing.  Therefore, it is important to remain in touch with cancer genetics in the future so that we can continue  to offer [name] the most up to date genetic testing.   Genetic testing did identify a variant of uncertain significance (VUS) was identified in the AXIN2 gene called c.2000G>A (p.Ser667Asn).  At this time, it is unknown if this variant is associated with increased cancer risk or if this is a normal finding, but most variants such as this get reclassified to being inconsequential. It should not be used to make medical management decisions. With time, we suspect the lab will determine the significance of this variant, if any. If we do learn more about it, we will try to contact [name] to discuss it further. However, it is important to stay in touch with [place] periodically and keep the address and phone number up to date.  ADDITIONAL GENETIC TESTING: We discussed with [name] that his genetic testing was fairly extensive.  If there are genes identified to increase cancer risk that can be analyzed in the future, we would be happy to discuss and coordinate this testing at that time.    CANCER SCREENING RECOMMENDATIONS: [name] test result is considered negative (normal).  While reassuring, this does not definitively rule out a hereditary predisposition to cancer. It is still possible that there could be genetic mutations that are undetectable by current technology. There could be genetic mutations in genes that have not been tested or identified to increase cancer risk.  Therefore, it is recommended he continue to follow the cancer management and screening guidelines provided by his primary healthcare provider.   An individual's cancer risk and medical management are not determined by genetic test results alone. Overall cancer risk assessment incorporates additional factors, including personal medical history, family history, and any available genetic information that may result in a personalized plan for cancer prevention and surveillance  RECOMMENDATIONS FOR FAMILY MEMBERS:  Individuals in this  family might be at some increased risk of developing cancer, over the general population risk, simply due to the family history of cancer.  We recommended women in this family have a yearly mammogram beginning at age 36, or 72 years younger than the earliest onset of cancer, an annual clinical breast exam, and perform monthly breast self-exams. Women in this family should also have a gynecological exam as recommended by their primary provider. All family members should be referred for colonoscopy starting at age 40.  FOLLOW-UP: Lastly, we discussed with [name] that cancer genetics is a rapidly advancing field and it is possible that new genetic tests will be appropriate for him and/or his family members in the future. We encouraged him to remain in contact with cancer genetics on an annual basis so we can update his personal and family histories and let him know of advances in cancer genetics that may benefit this family.   Our contact number was provided. [name] questions were answered to his satisfaction, and he knows he is welcome to call us at anytime with additional questions or concerns.   Roma Kayser,  MS, [place] Licensed, Insurance risk surveyor [email or web address]

## 2020-09-29 DIAGNOSIS — Z23 Encounter for immunization: Secondary | ICD-10-CM | POA: Diagnosis not present

## 2020-11-02 ENCOUNTER — Encounter: Payer: Self-pay | Admitting: Family Medicine

## 2020-11-02 NOTE — Telephone Encounter (Signed)
If pulse ox less than 95% and stays there he should go to ER Can have virtual visit tomorrow or Monday

## 2020-11-05 ENCOUNTER — Encounter: Payer: Self-pay | Admitting: Family Medicine

## 2020-11-05 ENCOUNTER — Telehealth (INDEPENDENT_AMBULATORY_CARE_PROVIDER_SITE_OTHER): Payer: BC Managed Care – PPO | Admitting: Family Medicine

## 2020-11-05 VITALS — HR 107

## 2020-11-05 DIAGNOSIS — J40 Bronchitis, not specified as acute or chronic: Secondary | ICD-10-CM | POA: Diagnosis not present

## 2020-11-05 DIAGNOSIS — U071 COVID-19: Secondary | ICD-10-CM

## 2020-11-05 MED ORDER — AZITHROMYCIN 250 MG PO TABS: ORAL_TABLET | ORAL | 0 refills | Status: DC

## 2020-11-05 MED ORDER — PREDNISONE 10 MG PO TABS: ORAL_TABLET | ORAL | 0 refills | Status: DC

## 2020-11-05 NOTE — Progress Notes (Signed)
Virtual Visit via Video Note  I connected with Dennis Kim on 11/05/20 at  2:20 PM EST by a video enabled telemedicine application and verified that I am speaking with the correct person using two identifiers.  Location: Patient: home  Provider: home    I discussed the limitations of evaluation and management by telemedicine and the availability of in person appointments. The patient expressed understanding and agreed to proceed.  History of Present Illness: Pt had a positive rapid covid test on Tuesday and pcr positive on Wed.  He had sore throat , fatigue and cough initially .   Most symptoms have resolved.  He has had chest pressure since Friday   O2 is 97-98%  He has a constant dull chest pressure cough is still persistent but dry.     Observations/Objective: Vitals:   11/05/20 1355  Pulse: (!) 107  SpO2: 97%   Pt is in nad  No sob   Assessment and Plan: 1. COVID-19   2. Bronchitis due to COVID-19 virus If no improvement in 2-3 days can refer to covid clinic  - azithromycin (ZITHROMAX Z-PAK) 250 MG tablet; As directed  Dispense: 6 each; Refill: 0 - predniSONE (DELTASONE) 10 MG tablet; TAKE 3 TABLETS PO QD FOR 3 DAYS THEN TAKE 2 TABLETS PO QD FOR 3 DAYS THEN TAKE 1 TABLET PO QD FOR 3 DAYS THEN TAKE 1/2 TAB PO QD FOR 3 DAYS  Dispense: 20 tablet; Refill: 0   Follow Up Instructions:    I discussed the assessment and treatment plan with the patient. The patient was provided an opportunity to ask questions and all were answered. The patient agreed with the plan and demonstrated an understanding of the instructions.   The patient was advised to call back or seek an in-person evaluation if the symptoms worsen or if the condition fails to improve as anticipated.  I provided 25 minutes of non-face-to-face time during this encounter.   Ann Held, DO

## 2020-11-06 MED ORDER — PROAIR HFA 108 (90 BASE) MCG/ACT IN AERS: 2.0000 | INHALATION_SPRAY | RESPIRATORY_TRACT | 5 refills | Status: DC | PRN

## 2020-11-07 MED ORDER — ALBUTEROL SULFATE HFA 108 (90 BASE) MCG/ACT IN AERS: 2.0000 | INHALATION_SPRAY | RESPIRATORY_TRACT | 5 refills | Status: DC | PRN

## 2020-11-08 ENCOUNTER — Encounter: Payer: Self-pay | Admitting: Family Medicine

## 2020-11-08 ENCOUNTER — Ambulatory Visit: Payer: BC Managed Care – PPO | Admitting: Family Medicine

## 2020-11-08 ENCOUNTER — Other Ambulatory Visit: Payer: Self-pay

## 2020-11-08 ENCOUNTER — Ambulatory Visit (HOSPITAL_BASED_OUTPATIENT_CLINIC_OR_DEPARTMENT_OTHER)
Admission: RE | Admit: 2020-11-08 | Discharge: 2020-11-08 | Disposition: A | Discharge status: Home / Self Care | Payer: BC Managed Care – PPO | Source: Ambulatory Visit | Attending: Family Medicine | Admitting: Family Medicine

## 2020-11-08 VITALS — BP 140/80 | HR 95 | Temp 98.4°F | Resp 18 | Ht 67.0 in | Wt 166.4 lb

## 2020-11-08 DIAGNOSIS — R1013 Epigastric pain: Secondary | ICD-10-CM | POA: Diagnosis not present

## 2020-11-08 DIAGNOSIS — R079 Chest pain, unspecified: Secondary | ICD-10-CM

## 2020-11-08 LAB — CBC WITH DIFFERENTIAL/PLATELET
Basophils Absolute: 0 10*3/uL (ref 0.0–0.1)
Basophils Relative: 0.2 % (ref 0.0–3.0)
Eosinophils Absolute: 0 10*3/uL (ref 0.0–0.7)
Eosinophils Relative: 0.2 % (ref 0.0–5.0)
HCT: 43.4 % (ref 39.0–52.0)
Hemoglobin: 14.8 g/dL (ref 13.0–17.0)
Lymphocytes Relative: 51.7 % — ABNORMAL HIGH (ref 12.0–46.0)
Lymphs Abs: 3.1 10*3/uL (ref 0.7–4.0)
MCHC: 34.2 g/dL (ref 30.0–36.0)
MCV: 88.9 fl (ref 78.0–100.0)
Monocytes Absolute: 0.6 10*3/uL (ref 0.1–1.0)
Monocytes Relative: 10.4 % (ref 3.0–12.0)
Neutro Abs: 2.2 10*3/uL (ref 1.4–7.7)
Neutrophils Relative %: 37.5 % — ABNORMAL LOW (ref 43.0–77.0)
Platelets: 204 10*3/uL (ref 150.0–400.0)
RBC: 4.88 Mil/uL (ref 4.22–5.81)
RDW: 13.6 % (ref 11.5–15.5)
WBC: 6 10*3/uL (ref 4.0–10.5)

## 2020-11-08 LAB — COMPREHENSIVE METABOLIC PANEL
ALT: 31 U/L (ref 0–53)
AST: 20 U/L (ref 0–37)
Albumin: 4.8 g/dL (ref 3.5–5.2)
Alkaline Phosphatase: 89 U/L (ref 39–117)
BUN: 12 mg/dL (ref 6–23)
CO2: 33 mEq/L — ABNORMAL HIGH (ref 19–32)
Calcium: 9.8 mg/dL (ref 8.4–10.5)
Chloride: 104 mEq/L (ref 96–112)
Creatinine, Ser: 0.87 mg/dL (ref 0.40–1.50)
GFR: 121.39 mL/min (ref >60.00)
Glucose, Bld: 101 mg/dL — ABNORMAL HIGH (ref 70–99)
Potassium: 3 mEq/L — ABNORMAL LOW (ref 3.5–5.1)
Sodium: 142 mEq/L (ref 135–145)
Total Bilirubin: 0.5 mg/dL (ref 0.2–1.2)
Total Protein: 7.5 g/dL (ref 6.0–8.3)

## 2020-11-08 LAB — SEDIMENTATION RATE: Sed Rate: 1 mm/hr (ref 0–15)

## 2020-11-08 LAB — D-DIMER, QUANTITATIVE: D-Dimer, Quant: <0.19 mcg/mL FEU (ref <0.50)

## 2020-11-08 LAB — TSH: TSH: 6.97 u[IU]/mL — ABNORMAL HIGH (ref 0.35–4.50)

## 2020-11-08 MED ORDER — FAMOTIDINE 20 MG PO TABS: 20.0000 mg | ORAL_TABLET | Freq: Two times a day (BID) | ORAL | 1 refills | Status: DC

## 2020-11-08 NOTE — Patient Instructions (Signed)

## 2020-11-08 NOTE — Progress Notes (Addendum)
Patient ID: Dennis Kim, male    DOB: 07-30-1996  Age: 25 y.o. MRN: [id number]    Subjective:  Subjective  HPI Dennis Kim presents for chest pain x 1 week---- he tested + for covid over 10 days ago    Pt also c/o sour taste in his mouth and burning in chest.   Not really assoc with food.    Review of Systems  Constitutional: Negative for appetite change, diaphoresis, fatigue and unexpected weight change.  Eyes: Negative for pain, redness and visual disturbance.  Respiratory: Negative for cough, chest tightness, shortness of breath and wheezing.   Cardiovascular: Positive for chest pain. Negative for palpitations and leg swelling.  Endocrine: Negative for cold intolerance, heat intolerance, polydipsia, polyphagia and polyuria.  Genitourinary: Negative for difficulty urinating, dysuria and frequency.  Neurological: Negative for dizziness, light-headedness, numbness and headaches.    History Past Medical History:  Diagnosis Date  . Asthma   . Family history of breast cancer   . Family history of colon cancer   . Family history of melanoma   . Family history of uterine cancer     He has a past surgical history that includes No past surgeries.   His family history includes Brain cancer (age of onset: 72) in his maternal grandmother; Breast cancer in his paternal grandmother; Breast cancer (age of onset: 71) in his mother; Breast cancer (age of onset: 65) in his maternal grandfather; Colon cancer (age of onset: 50) in his paternal grandfather; Hypertrophic cardiomyopathy (age of onset: 58) in his paternal uncle; Kidney disease in his paternal uncle; Melanoma in his mother; Thyroid disease in his father; Uterine cancer (age of onset: 8) in an other family member.He reports that he has never smoked. He has never used smokeless tobacco. He reports current alcohol use. He reports that he does not use drugs.  Current Outpatient Medications on File Prior to Visit  Medication Sig Dispense  Refill  . albuterol (VENTOLIN HFA) 108 (90 Base) MCG/ACT inhaler Inhale 2 puffs into the lungs every 4 (four) hours as needed for wheezing or shortness of breath. 18 g 5  . azithromycin (ZITHROMAX Z-PAK) 250 MG tablet As directed 6 each 0  . predniSONE (DELTASONE) 10 MG tablet TAKE 3 TABLETS PO QD FOR 3 DAYS THEN TAKE 2 TABLETS PO QD FOR 3 DAYS THEN TAKE 1 TABLET PO QD FOR 3 DAYS THEN TAKE 1/2 TAB PO QD FOR 3 DAYS 20 tablet 0   No current facility-administered medications on file prior to visit.     Objective:  Objective  Physical Exam Vitals and nursing note reviewed.  Constitutional:      General: He is sleeping. Vital signs are normal.     Appearance: He is well-developed and well-nourished.  HENT:     Head: Normocephalic and atraumatic.     Mouth/Throat:     Mouth: Oropharynx is clear and moist.  Eyes:     Extraocular Movements: EOM normal.     Pupils: Pupils are equal, round, and reactive to light.  Neck:     Thyroid: No thyromegaly.  Cardiovascular:     Rate and Rhythm: Normal rate and regular rhythm.     Heart sounds: No murmur heard.   Pulmonary:     Effort: Pulmonary effort is normal. No respiratory distress.     Breath sounds: Normal breath sounds. No wheezing or rales.  Chest:     Chest wall: No tenderness.  Abdominal:     Palpations: Abdomen  is soft.     Tenderness: There is no abdominal tenderness. There is no guarding or rebound.  Musculoskeletal:        General: No tenderness or edema.     Cervical back: Normal range of motion and neck supple.  Skin:    General: Skin is warm and dry.  Neurological:     Mental Status: He is oriented to person, place, and time.  Psychiatric:        Mood and Affect: Mood and affect normal.        Behavior: Behavior normal.        Thought Content: Thought content normal.        Judgment: Judgment normal.    BP 140/80 (BP Location: Right Arm, Patient Position: Sitting, Cuff Size: Normal)   Pulse 95   Temp 98.4 F (36.9  C) (Oral)   Resp 18   Ht 5\' 7"  (1.702 m)   Wt 166 lb 6.4 oz (75.5 kg)   SpO2 99%   BMI 26.06 kg/m  Wt Readings from Last 3 Encounters:  11/08/20 166 lb 6.4 oz (75.5 kg)  08/01/19 153 lb 12.8 oz (69.8 kg)  11/16/18 156 lb 4.9 oz (70.9 kg)     Lab Results  Component Value Date   WBC 10.3 11/12/2018   HGB 14.9 11/12/2018   HCT 42.3 11/12/2018   PLT 287 11/12/2018   GLUCOSE 154 (H) 11/13/2018   CHOL 181 05/21/2016   TRIG 144.0 05/21/2016   HDL 45.10 05/21/2016   LDLCALC 107 (H) 05/21/2016   ALT 81 (H) 11/12/2018   AST 38 11/12/2018   NA 140 11/13/2018   K 4.2 11/13/2018   CL 110 11/13/2018   CREATININE 0.79 11/13/2018   BUN 9 11/13/2018   CO2 24 11/13/2018   TSH 5.250 (H) 11/12/2018    ECHOCARDIOGRAM COMPLETE  Result Date: 08/04/2019   ECHOCARDIOGRAM REPORT   Patient Name:   Dennis Kim Date of Exam: 08/03/2019 Medical Rec #:  [id number]      Height:       67.0 in Accession #:    [id number]     Weight:       153.8 lb Date of Birth:  02-17-1996      BSA:          1.81 m Patient Age:    23 years       BP:           126/72 mmHg Patient Gender: M              HR:           64 bpm. Exam Location:  [place] [facility] Procedure: 2D Echo, Cardiac Doppler and Color Doppler Indications:    Hypertrophic cardiomyopathy  History:        Patient has prior history of Echocardiogram examinations, most                 recent 07/02/2018. Hypertrophic Cardiomyopathy                 Signs/Symptoms:Dyspnea.  Sonographer:    Cardell Peach Mujalli RDCS (AE) Referring Phys: [place]  1. Left ventricular ejection fraction, by visual estimation, is 60 to 65%. The left ventricle has normal function. Normal left ventricular size. Left ventricular septal wall thickness was moderately increased. There is no left ventricular hypertrophy.  2. Global right ventricle has normal systolic function.The right ventricular size is normal. No increase in right ventricular wall  thickness.  3. Left  atrial size was normal.  4. Right atrial size was normal.  5. The mitral valve is normal in structure. No evidence of mitral valve regurgitation. No evidence of mitral stenosis.  6. The tricuspid valve is normal in structure. Tricuspid valve regurgitation is trivial.  7. The aortic valve is normal in structure. Aortic valve regurgitation was not visualized by color flow Doppler. Structurally normal aortic valve, with no evidence of sclerosis or stenosis.  8. The pulmonic valve was normal in structure. Pulmonic valve regurgitation is trivial by color flow Doppler.  9. Normal pulmonary artery systolic pressure. 10. The inferior vena cava is normal in size with greater than 50% respiratory variability, suggesting right atrial pressure of 3 mmHg. FINDINGS  Left Ventricle: Left ventricular ejection fraction, by visual estimation, is 60 to 65%. The left ventricle has normal function. Left ventricular septal wall thickness was moderately increased. There is no left ventricular hypertrophy. Normal left ventricular size. Spectral Doppler shows Left ventricular diastolic parameters were normal pattern of LV diastolic filling. Minimal chordal SAM noted, AH, no LVOT gradient. Right Ventricle: The right ventricular size is normal. No increase in right ventricular wall thickness. Global RV systolic function is has normal systolic function. The tricuspid regurgitant velocity is 1.44 m/s, and with an assumed right atrial pressure  of 3 mmHg, the estimated right ventricular systolic pressure is normal at 11.3 mmHg. Left Atrium: Left atrial size was normal in size. Right Atrium: Right atrial size was normal in size Pericardium: There is no evidence of pericardial effusion. Mitral Valve: The mitral valve is normal in structure. No evidence of mitral valve stenosis by observation. No evidence of mitral valve regurgitation. Tricuspid Valve: The tricuspid valve is normal in structure. Tricuspid valve regurgitation is trivial by color  flow Doppler. Aortic Valve: The aortic valve is normal in structure. Aortic valve regurgitation was not visualized by color flow Doppler. The aortic valve is structurally normal, with no evidence of sclerosis or stenosis. Pulmonic Valve: The pulmonic valve was normal in structure. Pulmonic valve regurgitation is trivial by color flow Doppler. Aorta: The aortic root, ascending aorta and aortic arch are all structurally normal, with no evidence of dilitation or obstruction. Venous: The inferior vena cava is normal in size with greater than 50% respiratory variability, suggesting right atrial pressure of 3 mmHg. IAS/Shunts: No atrial level shunt detected by color flow Doppler. No ventricular septal defect is seen or detected. There is no evidence of an atrial septal defect.  LEFT VENTRICLE PLAX 2D LVIDd:         4.77 cm  Diastology LVIDs:         2.99 cm  LV e' lateral:   14.80 cm/s LV PW:         1.00 cm  LV E/e' lateral: 5.8 LV IVS:        1.16 cm  LV e' medial:    12.30 cm/s LVOT diam:     2.00 cm  LV E/e' medial:  7.0 LV SV:         71 ml LV SV Index:   39.08 LVOT Area:     3.14 cm  RIGHT VENTRICLE RV Basal diam:  3.55 cm RV S prime:     12.80 cm/s TAPSE (M-mode): 1.8 cm LEFT ATRIUM             Index       RIGHT ATRIUM           Index LA diam:  3.20 cm 1.77 cm/m  RA Area:     13.90 cm LA Vol (A2C):   45.5 ml 25.16 ml/m RA Volume:   38.80 ml  21.45 ml/m LA Vol (A4C):   43.3 ml 23.94 ml/m LA Biplane Vol: 44.3 ml 24.49 ml/m  AORTIC VALVE LVOT Vmax:   89.40 cm/s LVOT Vmean:  70.200 cm/s LVOT VTI:    0.212 m  AORTA Ao Root diam: 2.50 cm Ao Asc diam:  2.80 cm MITRAL VALVE                        TRICUSPID VALVE MV Area (PHT): 5.31 cm             TR Peak grad:   8.3 mmHg MV PHT:        41.47 msec           TR Vmax:        144.00 cm/s MV Decel Time: 143 msec MV E velocity: 86.50 cm/s 103 cm/s  SHUNTS MV A velocity: 48.80 cm/s 70.3 cm/s Systemic VTI:  0.21 m MV E/A ratio:  1.77       1.5       Systemic Diam:  2.00 cm  Jenne Campus MD Electronically signed by Jenne Campus MD Signature Date/Time: 08/04/2019/9:44:28 AM    Final     ekg-- poor r wave progression , no acute changes -- compared with 07/2019 Assessment & Plan:  Plan  I am having Dennis Kim start on famotidine. I am also having him maintain his azithromycin, predniSONE, and albuterol.  Meds ordered this encounter  Medications  . famotidine (PEPCID) 20 MG tablet    Sig: Take 1 tablet (20 mg total) by mouth 2 (two) times daily.    Dispense:  60 tablet    Refill:  1    Problem List Items Addressed This Visit      Unprioritized   Chest pain - Primary   Relevant Orders   EKG 12-Lead (Completed)   DG Chest 2 View   TSH   CBC with Differential/Platelet   Comprehensive metabolic panel   Sedimentation rate   D-Dimer, Quantitative    Other Visit Diagnoses    Dyspepsia       Relevant Medications   famotidine (PEPCID) 20 MG tablet      Follow-up: Return if symptoms worsen or fail to improve.  Ann Held, DO

## 2020-11-08 NOTE — Telephone Encounter (Signed)
Pt put on the schedule for this morning

## 2020-11-09 ENCOUNTER — Other Ambulatory Visit: Payer: Self-pay | Admitting: Family Medicine

## 2020-11-09 DIAGNOSIS — E876 Hypokalemia: Secondary | ICD-10-CM

## 2020-11-09 DIAGNOSIS — E039 Hypothyroidism, unspecified: Secondary | ICD-10-CM

## 2020-11-09 MED ORDER — POTASSIUM CHLORIDE ER 10 MEQ PO TBCR: 10.0000 meq | EXTENDED_RELEASE_TABLET | Freq: Every day | ORAL | 0 refills | Status: DC

## 2020-11-09 NOTE — Addendum Note (Signed)
Addended byDamita Dunnings D on: 11/09/2020 03:35 PM   Modules accepted: Orders

## 2020-11-09 NOTE — Telephone Encounter (Signed)
Per his labs--- 2 weeks

## 2020-11-09 NOTE — Telephone Encounter (Signed)
I will repeat the panel first and I can actually tx that with meds if it is just hypothyroidism

## 2020-11-16 ENCOUNTER — Encounter: Payer: Self-pay | Admitting: Family Medicine

## 2020-11-16 ENCOUNTER — Other Ambulatory Visit: Payer: Self-pay | Admitting: Family Medicine

## 2020-11-16 DIAGNOSIS — R1011 Right upper quadrant pain: Secondary | ICD-10-CM

## 2020-11-16 MED ORDER — OMEPRAZOLE 20 MG PO CPDR: 20.0000 mg | DELAYED_RELEASE_CAPSULE | Freq: Every day | ORAL | 3 refills | Status: DC

## 2020-11-16 NOTE — Telephone Encounter (Signed)
Omeprazole 20 mg po qd --sent to pharmacy And we will check [place] abd to look at [facility]

## 2020-11-23 ENCOUNTER — Other Ambulatory Visit (INDEPENDENT_AMBULATORY_CARE_PROVIDER_SITE_OTHER): Payer: BC Managed Care – PPO

## 2020-11-23 ENCOUNTER — Ambulatory Visit (HOSPITAL_BASED_OUTPATIENT_CLINIC_OR_DEPARTMENT_OTHER)
Admission: RE | Admit: 2020-11-23 | Discharge: 2020-11-23 | Disposition: A | Discharge status: Home / Self Care | Payer: BC Managed Care – PPO | Source: Ambulatory Visit | Attending: Family Medicine | Admitting: Family Medicine

## 2020-11-23 ENCOUNTER — Other Ambulatory Visit: Payer: Self-pay

## 2020-11-23 DIAGNOSIS — E039 Hypothyroidism, unspecified: Secondary | ICD-10-CM

## 2020-11-23 DIAGNOSIS — K824 Cholesterolosis of gallbladder: Secondary | ICD-10-CM | POA: Diagnosis not present

## 2020-11-23 DIAGNOSIS — R1011 Right upper quadrant pain: Secondary | ICD-10-CM | POA: Insufficient documentation

## 2020-11-23 DIAGNOSIS — E876 Hypokalemia: Secondary | ICD-10-CM | POA: Diagnosis not present

## 2020-11-23 LAB — BASIC METABOLIC PANEL
BUN: 11 mg/dL (ref 6–23)
CO2: 34 mEq/L — ABNORMAL HIGH (ref 19–32)
Calcium: 10.4 mg/dL (ref 8.4–10.5)
Chloride: 103 mEq/L (ref 96–112)
Creatinine, Ser: 0.84 mg/dL (ref 0.40–1.50)
GFR: 122.65 mL/min (ref >60.00)
Glucose, Bld: 88 mg/dL (ref 70–99)
Potassium: 4.4 mEq/L (ref 3.5–5.1)
Sodium: 140 mEq/L (ref 135–145)

## 2020-11-24 LAB — THYROID PANEL WITH TSH
Free Thyroxine Index: 2.9 (ref 1.4–3.8)
T3 Uptake: 30 % (ref 22–35)
T4, Total: 9.5 ug/dL (ref 4.9–10.5)
TSH: 3.66 mIU/L (ref 0.40–4.50)

## 2020-11-25 ENCOUNTER — Other Ambulatory Visit: Payer: Self-pay | Admitting: Family Medicine

## 2020-11-25 DIAGNOSIS — R1011 Right upper quadrant pain: Secondary | ICD-10-CM

## 2020-11-25 DIAGNOSIS — K824 Cholesterolosis of gallbladder: Secondary | ICD-10-CM

## 2020-11-30 ENCOUNTER — Other Ambulatory Visit: Payer: Self-pay | Admitting: Family Medicine

## 2020-11-30 DIAGNOSIS — R1013 Epigastric pain: Secondary | ICD-10-CM

## 2020-12-26 DIAGNOSIS — K824 Cholesterolosis of gallbladder: Secondary | ICD-10-CM | POA: Diagnosis not present

## 2020-12-26 DIAGNOSIS — K219 Gastro-esophageal reflux disease without esophagitis: Secondary | ICD-10-CM | POA: Diagnosis not present

## 2021-02-08 DIAGNOSIS — L309 Dermatitis, unspecified: Secondary | ICD-10-CM | POA: Diagnosis not present

## 2021-02-16 ENCOUNTER — Other Ambulatory Visit: Payer: Self-pay

## 2021-02-16 ENCOUNTER — Emergency Department: Payer: BC Managed Care – PPO

## 2021-02-16 ENCOUNTER — Emergency Department
Admission: EM | Admit: 2021-02-16 | Discharge: 2021-02-16 | Disposition: A | Discharge status: Home / Self Care | Payer: BC Managed Care – PPO | Attending: Emergency Medicine | Admitting: Emergency Medicine

## 2021-02-16 DIAGNOSIS — R0789 Other chest pain: Secondary | ICD-10-CM | POA: Insufficient documentation

## 2021-02-16 DIAGNOSIS — R42 Dizziness and giddiness: Secondary | ICD-10-CM | POA: Insufficient documentation

## 2021-02-16 DIAGNOSIS — J4530 Mild persistent asthma, uncomplicated: Secondary | ICD-10-CM | POA: Diagnosis not present

## 2021-02-16 DIAGNOSIS — R079 Chest pain, unspecified: Secondary | ICD-10-CM | POA: Diagnosis not present

## 2021-02-16 LAB — CBC
HCT: 46.3 % (ref 39.0–52.0)
Hemoglobin: 16.3 g/dL (ref 13.0–17.0)
MCH: 30.8 pg (ref 26.0–34.0)
MCHC: 35.2 g/dL (ref 30.0–36.0)
MCV: 87.4 fL (ref 80.0–100.0)
Platelets: 190 10*3/uL (ref 150–400)
RBC: 5.3 MIL/uL (ref 4.22–5.81)
RDW: 12.5 % (ref 11.5–15.5)
WBC: 6.9 10*3/uL (ref 4.0–10.5)
nRBC: 0 % (ref 0.0–0.2)

## 2021-02-16 LAB — BASIC METABOLIC PANEL
Anion gap: 10 (ref 5–15)
BUN: 15 mg/dL (ref 6–20)
CO2: 28 mmol/L (ref 22–32)
Calcium: 10 mg/dL (ref 8.9–10.3)
Chloride: 103 mmol/L (ref 98–111)
Creatinine, Ser: 0.79 mg/dL (ref 0.61–1.24)
GFR, Estimated: >60 mL/min (ref >60)
Glucose, Bld: 100 mg/dL — ABNORMAL HIGH (ref 70–99)
Potassium: 4 mmol/L (ref 3.5–5.1)
Sodium: 141 mmol/L (ref 135–145)

## 2021-02-16 LAB — TROPONIN I (HIGH SENSITIVITY)
Troponin I (High Sensitivity): <2 ng/L (ref <18)
Troponin I (High Sensitivity): <2 ng/L (ref <18)

## 2021-02-16 LAB — TSH: TSH: 2.769 u[IU]/mL (ref 0.350–4.500)

## 2021-02-16 MED ORDER — LIDOCAINE VISCOUS HCL 2 % MT SOLN
15.0000 mL | Freq: Once | OROMUCOSAL | Status: AC
  Administered 2021-02-16: 15 mL via ORAL
  Filled 2021-02-16: qty 15

## 2021-02-16 MED ORDER — ALUM & MAG HYDROXIDE-SIMETH 200-200-20 MG/5ML PO SUSP
15.0000 mL | Freq: Once | ORAL | Status: AC
  Administered 2021-02-16: 15 mL via ORAL
  Filled 2021-02-16: qty 30

## 2021-02-16 NOTE — ED Provider Notes (Signed)
[facility] Emergency Department Provider Note   ____________________________________________   Event Date/Time   First MD Initiated Contact with Patient 02/16/21 1553     (approximate)  I have reviewed the triage vital signs and the nursing notes.   HISTORY  Chief Complaint Chest Pain and Dizziness    HPI Dennis Kim is a 25 y.o. male with possible history of asthma, GERD, and nonobstructive hypertrophic cardiomyopathy who presents to the ED complaining of dizziness and chest pain.  Patient reports that he has had approximately 5 days of feeling lightheaded with "brain fog."  He states that the symptoms were initially intermittent but have become more constant over the past couple of days.  This been associated with a tight feeling in the middle of his chest.  He reports dealing with intermittent tightness in his chest for the past couple of years, was previously diagnosed with nonobstructive hypertrophic cardiomyopathy but told this was not a factor in his pain.  It was thought his chest pain was due to GERD following endoscopy showing esophagitis, patient states he has been taking omeprazole intermittently since then.  He denies any fevers, cough, or difficulty breathing.  He has not noticed any pain or swelling in his legs.        Past Medical History:  Diagnosis Date  . Asthma   . Family history of breast cancer   . Family history of colon cancer   . Family history of melanoma   . Family history of uterine cancer     Patient Active Problem List   Diagnosis Date Noted  . Genetic testing 08/16/2020  . Family history of breast cancer   . Family history of colon cancer   . Family history of melanoma   . Family history of uterine cancer   . Stevens-Johnson syndrome ([place]) 11/12/2018  . Influenza A 11/07/2018  . Urinary frequency 10/11/2018  . Mild persistent asthma 07/30/2018  . Seasonal and perennial allergic rhinitis 07/30/2018  . Asthma  07/18/2018  . Chest pain 07/18/2018  . Dyspnea on exertion 07/16/2018  . Palpitations 07/16/2018  . Hypertrophic cardiomyopathy ([place]) 07/16/2018  . Preventative health care 05/20/2016    Past Surgical History:  Procedure Laterality Date  . NO PAST SURGERIES      Prior to Admission medications   Medication Sig Start Date End Date Taking? Authorizing Provider  albuterol (VENTOLIN HFA) 108 (90 Base) MCG/ACT inhaler Inhale 2 puffs into the lungs every 4 (four) hours as needed for wheezing or shortness of breath. 11/07/20   Ann Held, DO  azithromycin (ZITHROMAX Z-PAK) 250 MG tablet As directed 11/05/20   Carollee Herter, Alferd Apa, DO  famotidine (PEPCID) 20 MG tablet Take 1 tablet (20 mg total) by mouth 2 (two) times daily. 11/30/20   Ann Held, DO  omeprazole (PRILOSEC) 20 MG capsule Take 1 capsule (20 mg total) by mouth daily. 11/16/20   Ann Held, DO  potassium chloride (KLOR-CON) 10 MEQ tablet Take 1 tablet (10 mEq total) by mouth daily. 11/09/20   Carollee Herter, Kendrick Fries R, DO  predniSONE (DELTASONE) 10 MG tablet TAKE 3 TABLETS PO QD FOR 3 DAYS THEN TAKE 2 TABLETS PO QD FOR 3 DAYS THEN TAKE 1 TABLET PO QD FOR 3 DAYS THEN TAKE 1/2 TAB PO QD FOR 3 DAYS 11/05/20   Ann Held, DO    Allergies Bactrim [sulfamethoxazole-trimethoprim]  Family History  Problem Relation Age of Onset  . Brain  cancer Maternal Grandmother 44       glioblastoma\  . Thyroid disease Father   . Breast cancer Mother 74       POT+  . Melanoma Mother   . Breast cancer Paternal Grandmother   . Kidney disease Paternal Uncle   . Hypertrophic cardiomyopathy Paternal Uncle 70  . Breast cancer Maternal Grandfather 27       ATM+  . Colon cancer Paternal Grandfather 54  . Uterine cancer Other 19       PGF's sister    Social History Social History   Tobacco Use  . Smoking status: Never Smoker  . Smokeless tobacco: Never Used  Vaping Use  . Vaping Use: Never used  Substance  Use Topics  . Alcohol use: Yes    Comment: rare  . Drug use: No    Review of Systems  Constitutional: No fever/chills Eyes: No visual changes. ENT: No sore throat. Cardiovascular: Positive for chest pain.  Positive for lightheadedness and dizziness. Respiratory: Denies shortness of breath. Gastrointestinal: No abdominal pain.  No nausea, no vomiting.  No diarrhea.  No constipation. Genitourinary: Negative for dysuria. Musculoskeletal: Negative for back pain. Skin: Negative for rash. Neurological: Negative for headaches, focal weakness or numbness.  ____________________________________________   PHYSICAL EXAM:  VITAL SIGNS: ED Triage Vitals  Enc Vitals Group     BP 02/16/21 1412 132/90     Pulse Rate 02/16/21 1412 99     Resp 02/16/21 1412 16     Temp 02/16/21 1412 98.4 F (36.9 C)     Temp Source 02/16/21 1412 Oral     SpO2 02/16/21 1412 99 %     Weight 02/16/21 1413 160 lb (72.6 kg)     Height 02/16/21 1413 '5\' 7"'  (1.702 m)     Head Circumference --      Peak Flow --      Pain Score 02/16/21 1412 8     Pain Loc --      Pain Edu? --      Excl. in [place]? --     Constitutional: Alert and oriented. Eyes: Conjunctivae are normal. Head: Atraumatic. Nose: No congestion/rhinnorhea. Mouth/Throat: Mucous membranes are moist. Neck: Normal ROM Cardiovascular: Normal rate, regular rhythm. Grossly normal heart sounds.  2+ radial pulses bilaterally. Respiratory: Normal respiratory effort.  No retractions. Lungs CTAB.  No chest wall tenderness to palpation. Gastrointestinal: Soft and nontender. No distention. Genitourinary: deferred Musculoskeletal: No lower extremity tenderness nor edema. Neurologic:  Normal speech and language. No gross focal neurologic deficits are appreciated. Skin:  Skin is warm, dry and intact. No rash noted. Psychiatric: Mood and affect are normal. Speech and behavior are normal.  ____________________________________________   LABS (all labs ordered  are listed, but only abnormal results are displayed)  Labs Reviewed  BASIC METABOLIC PANEL - Abnormal; Notable for the following components:      Result Value   Glucose, Bld 100 (*)    All other components within normal limits  CBC  TSH  TROPONIN I (HIGH SENSITIVITY)  TROPONIN I (HIGH SENSITIVITY)   ____________________________________________  EKG  ED ECG REPORT I, Blake Divine, the attending physician, personally viewed and interpreted this ECG.   Date: 02/16/2021  EKG Time: 14:07  Rate: 88  Rhythm: normal sinus rhythm  Axis: RAD  Intervals:none  ST&T Change: Inferior Q waves, similar to previous   PROCEDURES  Procedure(s) performed (including Critical Care):  Procedures   ____________________________________________   INITIAL IMPRESSION / ASSESSMENT AND PLAN /  ED COURSE       25 year old male with past medical history of asthma, GERD, and nonobstructive hypertrophic cardiomyopathy who presents to the ED complaining of worsening lightheadedness and dizziness for the past 5 days along with intermittent chest tightness that has been ongoing for a couple of years.  EKG shows no evidence of arrhythmia or ischemia, does show inferior Q waves similar to previous and likely related to his cardiomyopathy.  There are no acute ischemic changes and troponin is negative, low suspicion for ACS.  We will treat with GI cocktail and check second set of troponin.  Chest x-ray reviewed by me and shows no infiltrate, edema, or effusion.  I doubt PE or dissection at this time.   Repeat troponin is negative, TSH was added on and is also within normal limits.  Patient is appropriate for discharge home with cardiology and PCP follow-up, he was counseled to return to the ED for new worsening symptoms.  Patient agrees with plan.      ____________________________________________   FINAL CLINICAL IMPRESSION(S) / ED DIAGNOSES  Final diagnoses:  Atypical chest pain  Lightheadedness      ED Discharge Orders    None       Note:  This document was prepared using Dragon voice recognition software and may include unintentional dictation errors.   Blake Divine, MD 02/16/21 [phone]

## 2021-02-16 NOTE — ED Notes (Signed)
Pt started to get dizzy and could not focus on Monday, different than his acid reflux; took omeprazole yesterday but not today; feels a "constant chest pain"; went to urgent care last week for a skin rash was given prednisone.

## 2021-02-16 NOTE — ED Triage Notes (Signed)
Pt to Er via POV with complaints of centralized chest pain, non-radiating. Reports it feels like burning/ blockage/ pressure. History of acid reflux. States it started on Monday, today associated with dizziness. Also reports that today started noticing blood in his stools, describes it as streaks of red.

## 2021-02-18 ENCOUNTER — Encounter: Payer: Self-pay | Admitting: Family Medicine

## 2021-02-18 NOTE — Telephone Encounter (Signed)
Pt has an appointment on Thursday and been instructed to seek emergency care if new sxs present or sxs worsen. Please advise any recommendation.

## 2021-02-18 NOTE — Telephone Encounter (Signed)
I called patient to see what he would prefer  Patient chose to cancel with [facility] and see another provider   I scheduled him with [facility] tomorrow morning (5.02.2022)

## 2021-02-18 NOTE — Telephone Encounter (Signed)
Other option is to see someone else in the office before than --- but definitely if there is an opening we can get him in I will forward to [facility] as wll

## 2021-02-18 NOTE — Telephone Encounter (Signed)
FYI

## 2021-02-18 NOTE — Telephone Encounter (Signed)
Ok thanks 

## 2021-02-19 ENCOUNTER — Ambulatory Visit: Payer: BC Managed Care – PPO | Admitting: Family Medicine

## 2021-02-19 ENCOUNTER — Other Ambulatory Visit: Payer: Self-pay

## 2021-02-19 ENCOUNTER — Encounter: Payer: Self-pay | Admitting: Family Medicine

## 2021-02-19 VITALS — BP 138/80 | HR 120 | Temp 98.2°F | Ht 67.0 in | Wt 153.1 lb

## 2021-02-19 DIAGNOSIS — R6889 Other general symptoms and signs: Secondary | ICD-10-CM | POA: Diagnosis not present

## 2021-02-19 DIAGNOSIS — R202 Paresthesia of skin: Secondary | ICD-10-CM

## 2021-02-19 LAB — VITAMIN D 25 HYDROXY (VIT D DEFICIENCY, FRACTURES): VITD: 24.74 ng/mL — ABNORMAL LOW (ref 30.00–100.00)

## 2021-02-19 LAB — VITAMIN B12: Vitamin B-12: 254 pg/mL (ref 211–911)

## 2021-02-19 LAB — MAGNESIUM: Magnesium: 1.9 mg/dL (ref 1.5–2.5)

## 2021-02-19 MED ORDER — SUMATRIPTAN SUCCINATE 100 MG PO TABS: 100.0000 mg | ORAL_TABLET | ORAL | 0 refills | Status: DC | PRN

## 2021-02-19 NOTE — Patient Instructions (Addendum)
If you do not hear anything about your referral in the next 1-2 weeks, call our office and ask for an update.  Aim to do some physical exertion for 150 minutes per week. This is typically divided into 5 days per week, 30 minutes per day. The activity should be enough to get your heart rate up. Anything is better than nothing if you have time constraints.  Keep the diet clean and stay active.  Try to drink 55-60 oz of water daily outside of exercise.  Give [place] 2-3 business days to get the results of your labs back.   Give [place] some more time with the head scan.   Let us know if you need anything.

## 2021-02-19 NOTE — Progress Notes (Signed)
Chief Complaint  Patient presents with  . Dizziness  . Hospitalization Follow-up    Subjective: Patient is a 25 y.o. male here for heaviness of his head.  8 d of worsening heaviness of the R side of his head. Not exertional or positional.  He notices it for most of the day.  He denies any specific pain.  He does have a family history of stroke and migraines in his mother.  Work-up in the emergency department was unremarkable.  He is wondering if he has some sort of blockage or tumor.  His maternal grandmother and great uncle both have a history of brain tumors.  He is not having any difficulty with speech, trouble swallowing, vision changes, balance issues, or weakness.  He has been eating and drinking normally.  Denies recent illness.  Does not feel like he is going to pass out.  He does have a history of hypertrophic cardiomyopathy for which he follows with the cardiology team.  He cannot get in with them until June.  No recent peaks and stress levels.  He does have associated tingling/numbness in both of his pinkies.  Past Medical History:  Diagnosis Date  . Asthma   . Family history of breast cancer   . Family history of colon cancer   . Family history of melanoma   . Family history of uterine cancer     Objective: BP 138/80 (BP Location: Left Arm, Patient Position: Sitting, Cuff Size: Normal)   Pulse (!) 120   Temp 98.2 F (36.8 C) (Oral)   Ht 5\' 7"  (1.702 m)   Wt 153 lb 2 oz (69.5 kg)   SpO2 97%   BMI 23.98 kg/m  General: Awake, appears stated age HEENT: MMM, EOMi Neuro: Cranial nerves grossly intact, no cerebellar signs, 5/5 strength throughout, DTRs equal and symmetric throughout, no clonus, gait is normal MSK: There is no tenderness over the temporalis, TMJ bilaterally Heart: RRR, no murmurs, no bruits Lungs: CTAB, no rales, wheezes or rhonchi. No accessory muscle use Psych: Age appropriate judgment and insight, normal affect and mood  Assessment and Plan: Sensation of  heaviness - Plan: Ambulatory referral to Neurology, SUMAtriptan (IMITREX) 100 MG tablet, CT Head Wo Contrast  Paresthesias - Plan: Ambulatory referral to Neurology, Magnesium, B12, VITAMIN D 25 Hydroxy (Vit-D Deficiency, Fractures)  New diagnosis, uncertain prognosis.  Check imaging at his request.  Refer to neurology.  Trial Imitrex as needed for migraine treatment.  Could consider Elavil or SNRI if no improvement.  I would like to return to see his regular PCP in 3 weeks.  If she is unavailable, I will see him. The patient voiced understanding and agreement to the plan.  Greater than 30 minutes were spent with the patient in addition to reviewing their chart information on the same day of the visit.   [place], DO 02/19/21  10:32 AM

## 2021-02-20 ENCOUNTER — Ambulatory Visit (HOSPITAL_BASED_OUTPATIENT_CLINIC_OR_DEPARTMENT_OTHER)
Admission: RE | Admit: 2021-02-20 | Discharge: 2021-02-20 | Disposition: A | Discharge status: Home / Self Care | Payer: BC Managed Care – PPO | Source: Ambulatory Visit | Attending: Family Medicine | Admitting: Family Medicine

## 2021-02-20 DIAGNOSIS — R6889 Other general symptoms and signs: Secondary | ICD-10-CM | POA: Insufficient documentation

## 2021-02-20 DIAGNOSIS — R519 Headache, unspecified: Secondary | ICD-10-CM | POA: Diagnosis not present

## 2021-02-21 ENCOUNTER — Ambulatory Visit: Payer: BC Managed Care – PPO | Admitting: Family Medicine

## 2021-02-26 ENCOUNTER — Telehealth: Payer: Self-pay | Admitting: Neurology

## 2021-02-26 ENCOUNTER — Ambulatory Visit: Payer: BC Managed Care – PPO | Admitting: Neurology

## 2021-02-26 ENCOUNTER — Encounter: Payer: Self-pay | Admitting: Neurology

## 2021-02-26 ENCOUNTER — Other Ambulatory Visit: Payer: Self-pay

## 2021-02-26 VITALS — BP 138/86 | HR 72 | Ht 67.0 in | Wt 162.0 lb

## 2021-02-26 DIAGNOSIS — Z820 Family history of epilepsy and other diseases of the nervous system: Secondary | ICD-10-CM

## 2021-02-26 DIAGNOSIS — Z8489 Family history of other specified conditions: Secondary | ICD-10-CM

## 2021-02-26 DIAGNOSIS — R29818 Other symptoms and signs involving the nervous system: Secondary | ICD-10-CM

## 2021-02-26 DIAGNOSIS — Z808 Family history of malignant neoplasm of other organs or systems: Secondary | ICD-10-CM

## 2021-02-26 DIAGNOSIS — R519 Headache, unspecified: Secondary | ICD-10-CM

## 2021-02-26 NOTE — Progress Notes (Signed)
Subjective:    Patient ID: Dennis Kim is a 25 y.o. male.  HPI     Star Age, MD, PhD [facility] [address], [address] [place], [place] [address]  Dear Dr. Nani Ravens,   I saw your patient, Dennis Kim, upon your kind request in my neurologic clinic today for initial consultation of his headache and paresthesias.  The patient is unaccompanied today.  As you know, [name] is a 25 year old right-handed gentleman with an underlying medical history of asthma, reflux disease, hypertrophic cardiomyopathy, who reports intermittent heaviness on the right side of his head, as well as intermittent tingling in his hands.  He reports that the tingling subsided after a couple of days.  This was early on, symptoms started about 2 to 3 weeks ago when initially has had pressure was daily, more noticeable towards the end of the day.  He would not call it a headache as such as it was not debilitating and sometimes he would have difficulty concentrating with it.  He denies any sudden onset of one-sided weakness or numbness or tingling or droopy face or slurring of speech.  He has not noticed any triggers but had been traveling, he was in [place], he had some change in his dietary habits, he was eating Ramen noodles a couple of times.  He tries to hydrate well, he is a non-smoker and drinks alcohol rarely, no illicit drugs, no daily caffeine.  He tries to rest well, averages 7 to 8 hours of sleep.   He had nausea in the beginning, no vomiting, no visual symptoms such as blurry vision, double vision or loss of vision.  He has no associated photophobia or phonophobia.  Recent trial of Imitrex has not been effective and he tried Tylenol once or so without any obvious improvement.  He is up-to-date with his eye exam, had a checkup this year and has prescription eyeglasses which were stable.  He sees cardiology routinely and made a follow-up appointment as he was due.  He  reports a family history of migraines, mom has migraines and maternal grandmother as well as maternal great uncle have had brain tumors or brain cancer.  He has not had any syncopal spells.  I reviewed your office note from 02/19/2021, he was given a trial of Imitrex.   You ordered a head CT.  He had a head CT without contrast on 02/20/2021 and I reviewed the results:  IMPRESSION: 1. No acute intracranial abnormality.  Normal intracranial. 2. Right ethmoid air cell mucosal thickening.   He recently presented to the emergency room on 02/16/2021 with chest pain and dizziness.  I reviewed the emergency room records.  He reported a 5-day history of lightheadedness and feeling brain fog.  He reported intermittent tightness in his chest for a longer period of time.  Work-up included EKG which did not show any new findings and chest x-ray revealed:   IMPRESSION: No active cardiopulmonary disease.   Troponin and TSH were benign.  He had blood work through your office on 02/19/2021 and I reviewed the results: Vitamin B12 was on the lower end of the spectrum at 254, vitamin D mildly low at 24.7, magnesium normal at 1.9.  In the past 2 to 3 days he has not had any symptoms and feels at baseline.  His Past Medical History Is Significant For: Past Medical History:  Diagnosis Date  . Asthma   . Family history of breast cancer   . Family  history of colon cancer   . Family history of melanoma   . Family history of uterine cancer     Her Past Surgical History Is Significant For: Past Surgical History:  Procedure Laterality Date  . NO PAST SURGERIES      His Family History Is Significant For: Family History  Problem Relation Age of Onset  . Brain cancer Maternal Grandmother 44       glioblastoma\  . Thyroid disease Father   . Breast cancer Mother 25       POT+  . Melanoma Mother   . Migraines Mother   . Breast cancer Paternal Grandmother   . Kidney disease Paternal Uncle   . Hypertrophic  cardiomyopathy Paternal Uncle 77  . Breast cancer Maternal Grandfather 35       ATM+  . Colon cancer Paternal Grandfather 74  . Uterine cancer Other 64       PGF's sister    His Social History Is Significant For: Social History   Socioeconomic History  . Marital status: Single    Spouse name: Not on file  . Number of children: Not on file  . Years of education: Not on file  . Highest education level: Not on file  Occupational History    Comment: student [employer]  Tobacco Use  . Smoking status: Never Smoker  . Smokeless tobacco: Never Used  Vaping Use  . Vaping Use: Never used  Substance and Sexual Activity  . Alcohol use: Yes    Comment: rare  . Drug use: No  . Sexual activity: Never    Partners: Female  Other Topics Concern  . Not on file  Social History Narrative  . Not on file   Social Determinants of Health   Financial Resource Strain: Not on file  Food Insecurity: Not on file  Transportation Needs: Not on file  Physical Activity: Not on file  Stress: Not on file  Social Connections: Not on file    His Allergies Are:  Allergies  Allergen Reactions  . Bactrim [Sulfamethoxazole-Trimethoprim]     [name] syndrome  :   His Current Medications Are:  Outpatient Encounter Medications as of 02/26/2021  Medication Sig  . albuterol (VENTOLIN HFA) 108 (90 Base) MCG/ACT inhaler Inhale 2 puffs into the lungs every 4 (four) hours as needed for wheezing or shortness of breath.  . Cyanocobalamin (VITAMIN B 12 PO) Take by mouth.  Marland Kitchen omeprazole (PRILOSEC) 20 MG capsule Take 1 capsule (20 mg total) by mouth daily.  Marland Kitchen VITAMIN D PO Take by mouth.  . SUMAtriptan (IMITREX) 100 MG tablet Take 1 tablet (100 mg total) by mouth every 2 (two) hours as needed for migraine. May repeat in 2 hours if headache persists or recurs. (Patient not taking: Reported on 02/26/2021)   No facility-administered encounter medications on file as of 02/26/2021.  :   Review of Systems:  Out  of a complete 14 point review of systems, all are reviewed and negative with the exception of these symptoms as listed below:  Review of Systems  Neurological:       Here for consult on worsening numbness and lightheadedness. Pt reports 2 events over the last 30 days. Pt reports during this events he feels nausea and "heavyness" on the right side of his head. Reports these are new sx for him. Reports the last event was last week, but within the last 36 hours sx have been stable.    Objective:  Neurological Exam  Physical Exam Physical Examination:   Vitals:   02/26/21 0817  BP: 138/86  Pulse: 72   General Examination: The patient is a very pleasant 25 y.o. male in no acute distress. He appears well-developed and well-nourished and well groomed.   HEENT: Normocephalic, atraumatic, pupils are equal, round and reactive to light and accommodation. Funduscopic exam is normal with sharp disc margins noted. Extraocular tracking is good without limitation to gaze excursion or nystagmus noted. Normal smooth pursuit is noted. Hearing is grossly intact. Face is symmetric with normal facial animation and normal facial sensation. Speech is clear with no dysarthria noted. There is no hypophonia. There is no lip, neck/head, jaw or voice tremor. Neck is supple with full range of passive and active motion. There are no carotid bruits on auscultation. Oropharynx exam reveals: mild mouth dryness, good dental hygiene. Tongue protrudes centrally and palate elevates symmetrically.   Chest: Clear to auscultation without wheezing, rhonchi or crackles noted.  Heart: S1+S2+0, regular and normal without murmurs, rubs or gallops noted.   Abdomen: Soft, non-tender and non-distended with normal bowel sounds appreciated on auscultation.  Extremities: There is no pitting edema in the distal lower extremities bilaterally.   Skin: Warm and dry without trophic changes noted.  Musculoskeletal: exam reveals no obvious  joint deformities, tenderness or joint swelling or erythema.   Neurologically:  Mental status: The patient is awake, alert and oriented in all 4 spheres. His immediate and remote memory, attention, language skills and fund of knowledge are appropriate. There is no evidence of aphasia, agnosia, apraxia or anomia. Speech is clear with normal prosody and enunciation. Thought process is linear. Mood is normal and affect is normal.  Cranial nerves II - XII are as described above under HEENT exam. In addition: shoulder shrug is normal with equal shoulder height noted. Motor exam: Normal bulk, strength and tone is noted. There is no drift, tremor or rebound. Romberg is negative. Reflexes are 2+ throughout. Babinski: Toes are flexor bilaterally. Fine motor skills and coordination: intact with normal finger taps, normal hand movements, normal rapid alternating patting, normal foot taps and normal foot agility.  Cerebellar testing: No dysmetria or intention tremor on finger to nose testing. Heel to shin is unremarkable bilaterally. There is no truncal or gait ataxia.  Sensory exam: intact to light touch, vibration, temperature sense in the upper and lower extremities.  Gait, station and balance: He stands easily. No veering to one side is noted. No leaning to one side is noted. Posture is age-appropriate and stance is narrow based. Gait shows normal stride length and normal pace. No problems turning are noted. Tandem walk is unremarkable.      Assessment and Plan:   Assessment and Plan:  In summary, Elimelech T Kim is a very pleasant 25 y.o.-year old male with an underlying medical history of asthma, reflux disease, hypertrophic cardiomyopathy, who presents for evaluation of his right-sided head pressure of approximately 2 to 3 weeks duration with symptom improvement in the past 2 to 3 days.  He currently feels at baseline, neurological exam is normal.  He is largely reassured today.  Recent blood work through  your office indicated mild vitamin D deficiency and low normal vitamin B12, he has been supplementing these over-the-counter.  He is advised that he could have a mild form of tension headache versus mild migraines.  He has not noticed any debilitating headaches or other associated neurological symptoms with the exception of transient numbness in his pinky finger in the first  couple of days.  He is up-to-date with his eye exam, he sees cardiology on a regular basis for his mild hypertrophic cardiomyopathy.  He had a recent head CT without contrast which showed benign findings.  Given his family history of migraines and family history of brain tumor versus brain cancer I would like to proceed with a brain MRI with and without contrast to rule out a structural cause of his symptoms.  He is agreeable to this.  We talked about headache triggers.  He is advised to be mindful of certain food triggers, potentially MSG as he was eating Ramen noodles a couple of times before his headache symptoms started.  I do not believe he needs any migraine preventative or tension headache preventative at this time.  He is agreeable to watching his symptoms.  He is also in the process of getting evaluated for food allergies.  He had a previous evaluation for environmental allergies without any obvious allergens identified per his report.  He is advised to follow-up in this office on an as-needed basis, we will call him with his brain MRI results and so long it is benign, we can see him back if needed.  I answered all his questions today and he was in agreement.  Thank you very much for allowing me to participate in the care of this nice patient. If I can be of any further assistance to you please do not hesitate to call me at [phone].  Sincerely,   Star Age, MD, PhD

## 2021-02-26 NOTE — Telephone Encounter (Signed)
Scheduled at [facility] 02/27/21 45 mins MR Brain w/wo contrast Dr. Reece Leader Josem Kaufmann #[id number] exp. 02/26/21-08/24/21

## 2021-02-26 NOTE — Patient Instructions (Signed)
It was nice to meet you today.  Your neurological exam is normal thankfully.  I am glad to hear that you are feeling a little better lately.  It is possible that you have a mild form of migraine headaches and/or tension headaches.  Given your family history of migraines and a family history of brain tumors, I would like to proceed with a brain MRI with and without contrast to rule out a structural cause of your symptoms.  You had a recent benign head CT as well which is reassuring.  You have an updated eye examination and I agree with your supplementation of vitamin B12 and vitamin D.  Please continue to hydrate well with water, 6 to 8 cups/day are recommended generally speaking, 8 ounce size each.  Try to rest well, 7 to 8 hours of sleep are recommended for the average adult.  Avoid over indulging in caffeine and alcohol.  Please remember, common headache triggers are: sleep deprivation, dehydration, overheating, stress, hypoglycemia or skipping meals and blood sugar fluctuations, excessive pain medications or excessive alcohol use or caffeine withdrawal. Some people have food triggers such as aged cheese, orange juice or chocolate, especially dark chocolate, or MSG (monosodium glutamate). Try to avoid these headache triggers as much possible. It may be helpful to keep a headache diary to figure out what makes your headaches worse or brings them on and what alleviates them. Some people report headache onset after exercise but studies have shown that regular exercise may actually prevent headaches from coming. If you have exercise-induced headaches, please make sure that you drink plenty of fluid before and after exercising and that you do not over do it and do not overheat.  I would be happy to see you back in this clinic on an as-needed basis, we will keep you posted as to your brain MRI results by phone call.

## 2021-02-27 ENCOUNTER — Ambulatory Visit: Payer: BC Managed Care – PPO

## 2021-02-27 ENCOUNTER — Other Ambulatory Visit: Payer: Self-pay

## 2021-02-27 DIAGNOSIS — R519 Headache, unspecified: Secondary | ICD-10-CM

## 2021-02-27 DIAGNOSIS — Z808 Family history of malignant neoplasm of other organs or systems: Secondary | ICD-10-CM

## 2021-02-27 DIAGNOSIS — R29818 Other symptoms and signs involving the nervous system: Secondary | ICD-10-CM

## 2021-02-27 DIAGNOSIS — Z820 Family history of epilepsy and other diseases of the nervous system: Secondary | ICD-10-CM

## 2021-02-27 DIAGNOSIS — Z8489 Family history of other specified conditions: Secondary | ICD-10-CM

## 2021-02-27 MED ORDER — GADOBENATE DIMEGLUMINE 529 MG/ML IV SOLN
15.0000 mL | Freq: Once | INTRAVENOUS | Status: AC | PRN
  Administered 2021-02-27: 15 mL via INTRAVENOUS

## 2021-03-01 ENCOUNTER — Encounter: Payer: Self-pay | Admitting: Neurology

## 2021-03-04 NOTE — Progress Notes (Signed)
Please call patient and inform him that his brain MRI with and without contrast from 02/27/2021 was reported as normal.  He did have an incidental finding of chronic sinus changes, in keeping with chronic sinusitis on the right side.  If he has sinus congestion, drainage, and chronic sinus issues, he can talk to his primary care physician about getting evaluated with an ENT specialist (ear, nose and throat).

## 2021-03-25 ENCOUNTER — Other Ambulatory Visit: Payer: Self-pay

## 2021-03-25 DIAGNOSIS — T50905A Adverse effect of unspecified drugs, medicaments and biological substances, initial encounter: Secondary | ICD-10-CM

## 2021-03-25 DIAGNOSIS — D7212 Drug rash with eosinophilia and systemic symptoms syndrome: Secondary | ICD-10-CM

## 2021-03-25 HISTORY — DX: Drug rash with eosinophilia and systemic symptoms syndrome: D72.12

## 2021-03-25 HISTORY — DX: Adverse effect of unspecified drugs, medicaments and biological substances, initial encounter: T50.905A

## 2021-03-31 ENCOUNTER — Encounter: Payer: Self-pay | Admitting: Neurology

## 2021-04-02 DIAGNOSIS — M9902 Segmental and somatic dysfunction of thoracic region: Secondary | ICD-10-CM | POA: Diagnosis not present

## 2021-04-02 DIAGNOSIS — M542 Cervicalgia: Secondary | ICD-10-CM | POA: Diagnosis not present

## 2021-04-02 DIAGNOSIS — M9901 Segmental and somatic dysfunction of cervical region: Secondary | ICD-10-CM | POA: Diagnosis not present

## 2021-04-02 DIAGNOSIS — M546 Pain in thoracic spine: Secondary | ICD-10-CM | POA: Diagnosis not present

## 2021-04-03 DIAGNOSIS — L270 Generalized skin eruption due to drugs and medicaments taken internally: Secondary | ICD-10-CM | POA: Diagnosis not present

## 2021-04-05 DIAGNOSIS — M542 Cervicalgia: Secondary | ICD-10-CM | POA: Diagnosis not present

## 2021-04-05 DIAGNOSIS — M546 Pain in thoracic spine: Secondary | ICD-10-CM | POA: Diagnosis not present

## 2021-04-05 DIAGNOSIS — M9902 Segmental and somatic dysfunction of thoracic region: Secondary | ICD-10-CM | POA: Diagnosis not present

## 2021-04-05 DIAGNOSIS — M9901 Segmental and somatic dysfunction of cervical region: Secondary | ICD-10-CM | POA: Diagnosis not present

## 2021-04-08 ENCOUNTER — Encounter: Payer: Self-pay | Admitting: Cardiology

## 2021-04-08 ENCOUNTER — Other Ambulatory Visit: Payer: Self-pay

## 2021-04-08 ENCOUNTER — Ambulatory Visit (INDEPENDENT_AMBULATORY_CARE_PROVIDER_SITE_OTHER): Payer: BC Managed Care – PPO | Admitting: Cardiology

## 2021-04-08 VITALS — BP 110/78 | HR 82 | Ht 67.0 in | Wt 161.0 lb

## 2021-04-08 DIAGNOSIS — I422 Other hypertrophic cardiomyopathy: Secondary | ICD-10-CM

## 2021-04-08 DIAGNOSIS — J4530 Mild persistent asthma, uncomplicated: Secondary | ICD-10-CM | POA: Diagnosis not present

## 2021-04-08 DIAGNOSIS — R0600 Dyspnea, unspecified: Secondary | ICD-10-CM

## 2021-04-08 DIAGNOSIS — R0609 Other forms of dyspnea: Secondary | ICD-10-CM

## 2021-04-08 DIAGNOSIS — R002 Palpitations: Secondary | ICD-10-CM

## 2021-04-08 NOTE — Progress Notes (Signed)
Cardiology Office Note:    Date:  04/08/2021   ID:  Dennis Kim, DOB Nov 10, 1995, MRN [id number]  PCP:  Dennis Kim, Dennis Apa, DO  Cardiologist:  Dennis Campus, MD    Referring MD: Dennis Kim, Dennis Kim, *   Chief Complaint  Patient presents with   Hospitalization Follow-up    History of Present Illness:    Dennis Kim is a 25 y.o. male cardiac wise he was diagnosed with hypertrophic cardiomyopathy.  Only mild, there is some asymmetrical septal hypertrophy without significant gradient.  There is no arrhythmia there is no syncope.  There is no family history of premature sudden cardiac death.  He is being follow-up with me.  In the meantime there are a lot of issues happening.  He developed some allergic reaction to medication got a lot of skin changes right now he is being followed by dermatologist required steroids.  Also in April he end up in the emergency room very nonspecific symptoms he had some atypical chest pain some brain fog.  He was told may be having migraine some medication has been given to him with not much response.  In the meantime cardiac wise he is doing well.  There is no syncope there is no palpitation he still can exercise with no major difficulties.  He did not notice any worsening of his shortness of breath.  Overall seems to be doing well.  Past Medical History:  Diagnosis Date   Asthma    Chest pain 07/18/2018   Chest pain 07/18/2018   DRESS syndrome 03/25/2021   Dyspnea on exertion 07/16/2018   Family history of brain cancer    Family history of breast cancer    Family history of colon cancer    Family history of melanoma    Family history of uterine cancer    Hypertrophic cardiomyopathy ([place]) 07/16/2018   Nonobstructive, anterior septal 17 mm thickness   Influenza A 11/07/2018   Mild persistent asthma 07/30/2018   Palpitations 07/16/2018   Preventative health care 05/20/2016   Seasonal and perennial allergic rhinitis 07/30/2018   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 Dose pack   triamcinolone cream (KENALOG) 0.1 % Apply 1 application topically 2 (two) times daily.     Allergies:   Bactrim [sulfamethoxazole-trimethoprim] and Sulfa antibiotics   Social History   Socioeconomic History   Marital status: Single    Spouse name: Not on file   Number of children: Not on file   Years of education: Not on file   Highest education level: Not on file  Occupational History    Comment: student   Tobacco Use   Smoking status: Never   Smokeless tobacco: Never  Vaping Use   Vaping Use: Never used  Substance and Sexual Activity   Alcohol use: Yes    Comment: rare   Drug use: No   Sexual activity: Never    Partners: Female  Other Topics Concern   Not on file  Social History Narrative   Not on file   Social Determinants of Health   Financial Resource Strain: Not on file  Food Insecurity: Not on file  Transportation Needs: Not on file  Physical Activity: Not on file  Stress:  Not on file  Social Connections: Not on file     Family History: The patient's family history includes Brain cancer (age of onset: 15) in his maternal grandmother; Breast cancer in his paternal grandmother; Breast cancer (age of onset: 54) in his mother; Breast cancer (age of onset: 17) in his maternal grandfather; Colon cancer (age of onset: 30) in his paternal grandfather; Hypertrophic cardiomyopathy (age of onset: 54) in his paternal uncle; Kidney disease in his paternal uncle; Melanoma in his mother; Migraines in his mother; Thyroid disease in his father; Uterine cancer (age of onset: 58) in an other family member. ROS:   Please see the history of present illness.    All 14 point review of systems  negative except as described per history of present illness  EKGs/Labs/Other Studies Reviewed:      Recent Labs: 11/08/2020: ALT 31 02/16/2021: BUN 15; Creatinine, Ser 0.79; Hemoglobin 16.3; Platelets 190; Potassium 4.0; Sodium 141; TSH 2.769 02/19/2021: Magnesium 1.9  Recent Lipid Panel    Component Value Date/Time   CHOL 181 05/21/2016 0802   TRIG 144.0 05/21/2016 0802   HDL 45.10 05/21/2016 0802   CHOLHDL 4 05/21/2016 0802   VLDL 28.8 05/21/2016 0802   LDLCALC 107 (H) 05/21/2016 0802    Physical Exam:    VS:  BP 110/78 (BP Location: Right Arm, Patient Position: Sitting, Cuff Size: Normal)   Pulse 82   Ht 5\' 7"  (1.702 m)   Wt 161 lb (73 kg)   SpO2 97%   BMI 25.22 kg/m     Wt Readings from Last 3 Encounters:  04/08/21 161 lb (73 kg)  02/26/21 162 lb (73.5 kg)  02/19/21 153 lb 2 oz (69.5 kg)     GEN:  Well nourished, well developed in no acute distress HEENT: Normal NECK: No JVD; No carotid bruits LYMPHATICS: No lymphadenopathy CARDIAC: RRR, no murmurs, no rubs, no gallops RESPIRATORY:  Clear to auscultation without rales, wheezing or rhonchi  ABDOMEN: Soft, non-tender, non-distended MUSCULOSKELETAL:  No edema; No deformity  SKIN: Warm and dry LOWER EXTREMITIES: no swelling NEUROLOGIC:  Alert and oriented x 3 PSYCHIATRIC:  Normal affect   ASSESSMENT:    1. Hypertrophic cardiomyopathy ([place])   2. Mild persistent asthma without complication   3. Palpitations   4. Dyspnea on exertion    PLAN:    In order of problems listed above:  Hypertrophic cardiomyopathy: I will repeat echocardiogram.  I will not do EKG today since I do have EKG from April of this year, on top of that he does have multiple skin changes and I simply do not want to irritate this by putting stickers on his chest.  Again echocardiogram will be don.  I did review his MRI which was done in 2019 showing local basal anteroseptal and anterior moderate asymmetrical hypertrophy.  There was no evidence of  obstruction.  There was no late gadolinium enhancement, there was no marked hypertrophy it was less than 30 mm.  Overall MRI showed encouraging futures. Mild asthma stable Palpitations denies having any [place] exertion no problem. We will schedule him to have another echocardiogram to recheck on left ventricle hypertrophy and asymmetrical hypertrophy.  On the physical exam I do not hear any murmur therefore I do thinking of significant MR or LVOT obstruction.  Disappointing to the fact that he did have genetic testing done however there were no genetic testing done for hypertrophic cardiomyopathy.  He did have multiple genes checked for cancer.  I did review  note from the emergency room for that visit Medication Adjustments/Labs and Tests Ordered: Current medicines are reviewed at length with the patient today.  Concerns regarding medicines are outlined above.  No orders of the defined types were placed in this encounter.  Medication changes: No orders of the defined types were placed in this encounter.   Signed, Park Liter, MD, [facility] 04/08/2021 8:44 AM    [place]

## 2021-04-08 NOTE — Patient Instructions (Signed)
Medication Instructions:  Your physician recommends that you continue on your current medications as directed. Please refer to the Current Medication list given to you today.  *If you need a refill on your cardiac medications before your next appointment, please call your pharmacy*   Lab Work: None If you have labs (blood work) drawn today and your tests are completely normal, you will receive your results only by: [place] (if you have MyChart) OR A paper copy in the mail If you have any lab test that is abnormal or we need to change your treatment, we will call you to review the results.   Testing/Procedures: Your physician has requested that you have an echocardiogram. Echocardiography is a painless test that uses sound waves to create images of your heart. It provides your doctor with information about the size and shape of your heart and how well your heart's chambers and valves are working. This procedure takes approximately one hour. There are no restrictions for this procedure.    Follow-Up: At [facility], you and your health needs are our priority.  As part of our continuing mission to provide you with exceptional heart care, we have created designated Provider Care Teams.  These Care Teams include your primary Cardiologist (physician) and Advanced Practice Providers (APPs -  Physician Assistants and Nurse Practitioners) who all work together to provide you with the care you need, when you need it.  We recommend signing up for the patient portal called "MyChart".  Sign up information is provided on this After Visit Summary.  MyChart is used to connect with patients for Virtual Visits (Telemedicine).  Patients are able to view lab/test results, encounter notes, upcoming appointments, etc.  Non-urgent messages can be sent to your provider as well.   To learn more about what you can do with MyChart, go to [email or web address].    Your next appointment:   1 year(s)  The  format for your next appointment:   In Person  Provider:   Jenne Campus, MD   Other Instructions  Echocardiogram An echocardiogram is a test that uses sound waves (ultrasound) to produce images of the heart. Images from an echocardiogram can provide important information about: Heart size and shape. The size and thickness and movement of your heart's walls. Heart muscle function and strength. Heart valve function or if you have stenosis. Stenosis is when the heart valves are too narrow. If blood is flowing backward through the heart valves (regurgitation). A tumor or infectious growth around the heart valves. Areas of heart muscle that are not working well because of poor blood flow or injury from a heart attack. Aneurysm detection. An aneurysm is a weak or damaged part of an artery wall. The wall bulges out from the normal force of blood pumping through the body. Tell a health care provider about: Any allergies you have. All medicines you are taking, including vitamins, herbs, eye drops, creams, and over-the-counter medicines. Any blood disorders you have. Any surgeries you have had. Any medical conditions you have. Whether you are pregnant or may be pregnant. What are the risks? Generally, this is a safe test. However, problems may occur, including an allergic reaction to dye (contrast) that may be used during the test. What happens before the test? No specific preparation is needed. You may eat and drink normally. What happens during the test?  You will take off your clothes from the waist up and put on a hospital gown. Electrodes or electrocardiogram (ECG)patches may be placed  on your chest. The electrodes or patches are then connected to a device that monitors your heart rate and rhythm. You will lie down on a table for an ultrasound exam. A gel will be applied to your chest to help sound waves pass through your skin. A handheld device, called a transducer, will be  pressed against your chest and moved over your heart. The transducer produces sound waves that travel to your heart and bounce back (or "echo" back) to the transducer. These sound waves will be captured in real-time and changed into images of your heart that can be viewed on a video monitor. The images will be recorded on a computer and reviewed by your health care provider. You may be asked to change positions or hold your breath for a short time. This makes it easier to get different views or better views of your heart. In some cases, you may receive contrast through an IV in one of your veins. This can improve the quality of the pictures from your heart. The procedure may vary among health care providers and hospitals. What can I expect after the test? You may return to your normal, everyday life, including diet, activities, andmedicines, unless your health care provider tells you not to do that. Follow these instructions at home: It is up to you to get the results of your test. Ask your health care provider, or the department that is doing the test, when your results will be ready. Keep all follow-up visits. This is important. Summary An echocardiogram is a test that uses sound waves (ultrasound) to produce images of the heart. Images from an echocardiogram can provide important information about the size and shape of your heart, heart muscle function, heart valve function, and other possible heart problems. You do not need to do anything to prepare before this test. You may eat and drink normally. After the echocardiogram is completed, you may return to your normal, everyday life, unless your health care provider tells you not to do that. This information is not intended to replace advice given to you by your health care provider. Make sure you discuss any questions you have with your healthcare provider. Document Revised: 05/29/2020 Document Reviewed: 05/29/2020 Elsevier Patient Education   2022 [employer].

## 2021-04-09 DIAGNOSIS — M546 Pain in thoracic spine: Secondary | ICD-10-CM | POA: Diagnosis not present

## 2021-04-09 DIAGNOSIS — M9902 Segmental and somatic dysfunction of thoracic region: Secondary | ICD-10-CM | POA: Diagnosis not present

## 2021-04-09 DIAGNOSIS — M542 Cervicalgia: Secondary | ICD-10-CM | POA: Diagnosis not present

## 2021-04-09 DIAGNOSIS — M9901 Segmental and somatic dysfunction of cervical region: Secondary | ICD-10-CM | POA: Diagnosis not present

## 2021-04-11 DIAGNOSIS — M9901 Segmental and somatic dysfunction of cervical region: Secondary | ICD-10-CM | POA: Diagnosis not present

## 2021-04-11 DIAGNOSIS — M542 Cervicalgia: Secondary | ICD-10-CM | POA: Diagnosis not present

## 2021-04-11 DIAGNOSIS — M546 Pain in thoracic spine: Secondary | ICD-10-CM | POA: Diagnosis not present

## 2021-04-11 DIAGNOSIS — M9902 Segmental and somatic dysfunction of thoracic region: Secondary | ICD-10-CM | POA: Diagnosis not present

## 2021-04-16 ENCOUNTER — Encounter: Payer: Self-pay | Admitting: Neurology

## 2021-04-16 ENCOUNTER — Other Ambulatory Visit: Payer: Self-pay

## 2021-04-16 ENCOUNTER — Ambulatory Visit: Payer: BC Managed Care – PPO | Admitting: Neurology

## 2021-04-16 VITALS — BP 148/90 | HR 89 | Ht 67.0 in | Wt 160.0 lb

## 2021-04-16 DIAGNOSIS — R519 Headache, unspecified: Secondary | ICD-10-CM

## 2021-04-16 DIAGNOSIS — R41840 Attention and concentration deficit: Secondary | ICD-10-CM

## 2021-04-16 NOTE — Progress Notes (Signed)
Subjective:    Patient ID: Dennis Kim is a 25 y.o. male.  HPI    Interim history:   Dennis Kim is a 25 year old right-handed gentleman with an underlying medical history of asthma, reflux disease, hypertrophic cardiomyopathy (followed by cardiology), who presents for follow-up consultation of his recurrent headaches and paresthesias.  The patient is unaccompanied today.  I first met him at the request of Dr. Nani Ravens on 02/26/2021, at which time the patient reported intermittent heaviness feeling on the right side of his head as well as intermittent tingling in his hands.  His exam was benign.  Headaches had improved a little bit and he did not have any debilitating headaches.  We talked about headache triggers. He was advised to proceed with a brain MRI.  He had a brain MRI with and without contrast on 02/27/2021 and I reviewed the results:      IMPRESSION: Unremarkable MRI scan of the brain with and without contrast.  Incidental changes of right ethmoid paranasal sinusitis are noted.   He was notified of his test results by [facility] email.  Today, 04/16/2021: He reports that his headache symptoms recurred earlier this month.  He feels that it is not actually a headache as the sensation is not painful but more in keeping with brain fog and heaviness perhaps.  He tried sumatriptan and and about 2 days later he developed a systemic rash.  He saw dermatology who felt that the Imitrex could have caused the rash and he was treated initially with a topical cream but then placed on prednisone starting at 60 mg strength x 4 days.  He is on a slow taper, currently on 40 mg daily.  He has noticed recurrence of some brain fog type symptoms, difficulty with concentration, some mood related changes but denies any depression as such.  Has not had a recheck with his primary care. He has been taking vitamin D since it was mildly low.  He has not had any sudden onset of one-sided weakness or numbness or tingling or  droopy face or slurring of speech.  He has had some neck muscle tightness, started seeing a chiropractor and treatments with a chiropractor have been beneficial.  He has a few more appointments pending.  He has an echocardiogram pending for next month, had a recent routine checkup with his cardiologist.  He does not drink caffeine daily, he drinks alcohol rarely, he tries to hydrate well with water.  The patient's allergies, current medications, family history, past medical history, past social history, past surgical history and problem list were reviewed and updated as appropriate.  Previously:   02/26/21: (He) reports intermittent heaviness on the right side of his head, as well as intermittent tingling in his hands.  He reports that the tingling subsided after a couple of days.  This was early on, symptoms started about 2 to 3 weeks ago when initially has had pressure was daily, more noticeable towards the end of the day.  He would not call it a headache as such as it was not debilitating and sometimes he would have difficulty concentrating with it.  He denies any sudden onset of one-sided weakness or numbness or tingling or droopy face or slurring of speech.  He has not noticed any triggers but had been traveling, he was in [place], he had some change in his dietary habits, he was eating Ramen noodles a couple of times.  He tries to hydrate well, he is a non-smoker and drinks alcohol  rarely, no illicit drugs, no daily caffeine.  He tries to rest well, averages 7 to 8 hours of sleep.   He had nausea in the beginning, no vomiting, no visual symptoms such as blurry vision, double vision or loss of vision.  He has no associated photophobia or phonophobia.  Recent trial of Imitrex has not been effective and he tried Tylenol once or so without any obvious improvement.   He is up-to-date with his eye exam, had a checkup this year and has prescription eyeglasses which were stable.  He sees cardiology routinely  and made a follow-up appointment as he was due.   He reports a family history of migraines, mom has migraines and maternal grandmother as well as maternal great uncle have had brain tumors or brain cancer.   He has not had any syncopal spells.  I reviewed your office note from 02/19/2021, he was given a trial of Imitrex.   You ordered a head CT.  He had a head CT without contrast on 02/20/2021 and I reviewed the results: IMPRESSION: 1. No acute intracranial abnormality.  Normal intracranial. 2. Right ethmoid air cell mucosal thickening.   He recently presented to the emergency room on 02/16/2021 with chest pain and dizziness.  I reviewed the emergency room records.  He reported a 5-day history of lightheadedness and feeling brain fog.  He reported intermittent tightness in his chest for a longer period of time.  Work-up included EKG which did not show any new findings and chest x-ray revealed:   IMPRESSION: No active cardiopulmonary disease.   Troponin and TSH were benign.   He had blood work through your office on 02/19/2021 and I reviewed the results: Vitamin B12 was on the lower end of the spectrum at 254, vitamin D mildly low at 24.7, magnesium normal at 1.9.   In the past 2 to 3 days he has not had any symptoms and feels at baseline.  His Past Medical History Is Significant For: Past Medical History:  Diagnosis Date   Asthma    Chest pain 07/18/2018   Chest pain 07/18/2018   DRESS syndrome 03/25/2021   Dyspnea on exertion 07/16/2018   Family history of brain cancer    Family history of breast cancer    Family history of colon cancer    Family history of melanoma    Family history of uterine cancer    Hypertrophic cardiomyopathy ([place]) 07/16/2018   Nonobstructive, anterior septal 17 mm thickness   Influenza A 11/07/2018   Mild persistent asthma 07/30/2018   Palpitations 07/16/2018   Preventative health care 05/20/2016   Seasonal and perennial allergic rhinitis 07/30/2018   Stevens-Johnson  syndrome ([place]) 11/12/2018   Urinary frequency 10/11/2018    His Past Surgical History Is Significant For: Past Surgical History:  Procedure Laterality Date   NO PAST SURGERIES      His Family History Is Significant For: Family History  Problem Relation Age of Onset   Brain cancer Maternal Grandmother 12       glioblastoma\   Thyroid disease Father    Breast cancer Mother 49       POT+   Melanoma Mother    Migraines Mother    Breast cancer Paternal Grandmother    Kidney disease Paternal Uncle    Hypertrophic cardiomyopathy Paternal Uncle 61   Breast cancer Maternal Grandfather 73       ATM+   Colon cancer Paternal Grandfather 39   Uterine cancer Other 81  PGF's sister    His Social History Is Significant For: Social History   Socioeconomic History   Marital status: Single    Spouse name: Not on file   Number of children: Not on file   Years of education: Not on file   Highest education level: Not on file  Occupational History    Comment: student [employer]  Tobacco Use   Smoking status: Never   Smokeless tobacco: Never  Vaping Use   Vaping Use: Never used  Substance and Sexual Activity   Alcohol use: Yes    Comment: rare   Drug use: No   Sexual activity: Never    Partners: Female  Other Topics Concern   Not on file  Social History Narrative   Not on file   Social Determinants of Health   Financial Resource Strain: Not on file  Food Insecurity: Not on file  Transportation Needs: Not on file  Physical Activity: Not on file  Stress: Not on file  Social Connections: Not on file    His Allergies Are:  Allergies  Allergen Reactions   Bactrim [Sulfamethoxazole-Trimethoprim]     [name] syndrome   Sulfa Antibiotics Hives  :   His Current Medications Are:  Outpatient Encounter Medications as of 04/16/2021  Medication Sig   predniSONE (DELTASONE) 10 MG tablet Take 10 mg by mouth See admin instructions. Dose pack   triamcinolone cream  (KENALOG) 0.1 % Apply 1 application topically 2 (two) times daily.   No facility-administered encounter medications on file as of 04/16/2021.  :  Review of Systems:  Out of a complete 14 point review of systems, all are reviewed and negative with the exception of these symptoms as listed below:  Review of Systems  Neurological:        Here for f/u on worsening h/a sx. Reports the heaviness and pressure in his head has not improved. Pt reports the brain fog has increased mostly since last visit.  Pt reports he had an intolerance to the sumatriptan and had to stop due to skin irritation.    Objective:  Neurological Exam  Physical Exam Physical Examination:   Vitals:   04/16/21 0829  BP: (!) 148/90  Pulse: 89   General Examination: The patient is a very pleasant 25 y.o. male in no acute distress. He appears well-developed and well-nourished and well groomed.   HEENT: Normocephalic, atraumatic, pupils are equal, round and reactive to light, no light sensitivity, tracking is well-preserved, face is symmetric with normal facial animation, speech is clear without dysarthria or hypophonia or voice tremor.  Neck is supple with full range of motion, no carotid bruits. Oropharynx exam reveals: mild mouth dryness, good dental hygiene. Tongue protrudes centrally and palate elevates symmetrically.   Chest: Clear to auscultation without wheezing, rhonchi or crackles noted.   Heart: S1+S2+0, regular and normal without murmurs, rubs or gallops noted.   Abdomen: Soft, non-tender and non-distended.   Extremities: There is no pitting edema in the distal lower extremities bilaterally.   Skin: Warm and dry without trophic changes noted.   Musculoskeletal: exam reveals no obvious joint deformities.   Neurologically: Mental status: The patient is awake, alert and oriented in all 4 spheres. His immediate and remote memory, attention, language skills and fund of knowledge are appropriate. There is no  evidence of aphasia, agnosia, apraxia or anomia. Speech is clear with normal prosody and enunciation. Thought process is linear. Mood is normal and affect is normal. Cranial nerves II -  XII are as described above under HEENT exam. Motor exam: Normal bulk, strength and tone is noted. There is no drift, tremor or rebound. Romberg is negative. Reflexes are 2+ throughout. Fine motor skills and coordination: grossly intact. Cerebellar testing: No dysmetria or intention tremor on finger to nose testing. There is no truncal or gait ataxia. Sensory exam: intact to light touch in the upper and lower extremities. Gait, station and balance: He stands easily. No veering to one side is noted. No leaning to one side is noted. Posture is age-appropriate and stance is narrow based. Gait shows normal stride length and normal pace. No problems turning are noted. Tandem walk is unremarkable.       Assessment and Plan:    In summary, Dennis Kim is a very pleasant 25 year old male with an underlying medical history of asthma, reflux disease, hypertrophic cardiomyopathy, who presents for reevaluation of his headache like symptoms of several months duration, intermittent with recent recurrence earlier this month.  He has been treated for a systemic rash after he took Imitrex.  He is currently tapering prednisone per dermatology.  He has a nonfocal neurological exam, recent brain MRI was benign as well.  He is largely reassured again today.  Blood work through primary care showed mildly low vitamin D recently as well as vitamin B12 on the lower end of the spectrum.  He is advised to follow-up with his primary care for a recheck on his labs and also to discuss his concentration difficulty and mood related symptoms.  He denies any frank depression.  He may benefit from evaluation for ADD.  He is encouraged to discuss this with the primary care.  His headache is not significant enough to warrant prescription medicine.  He is in  agreement.  The frequency of his head pressure is also not supportive of the need for a preventative medication. He has recently seen a chiropractor for neck related discomfort and has benefited from treatments.  He had a recent checkup with his cardiologist as well.  He previously had a head CT without contrast which showed benign findings as well. At this juncture, he is advised to follow-up with his primary care provider.  We talked about side effects of prednisone which can be difficult to pinpoint as as prednisone can cause different side effects and different people but insomnia, mood irritability can be seen with taking high-dose prednisone.  He is planning to finish the taper.  He is encouraged to stay well-hydrated, well rested, and continue with his vitamin D and vitamin B12 supplements.  I answered all his questions today and he was in agreement with the plan.   I spent 33 minutes in total face-to-face time and in reviewing records during pre-charting, more than 50% of which was spent in counseling and coordination of care, reviewing test results, reviewing medications and treatment regimen and/or in discussing or reviewing the diagnosis of Headache, the prognosis and treatment options. Pertinent laboratory and imaging test results that were available during this visit with the patient were reviewed by me and considered in my medical decision making (see chart for details).

## 2021-04-16 NOTE — Patient Instructions (Signed)
It was nice to see you again today.  Your neurological exam is benign thankfully.  I am sorry you had a reaction to Imitrex.  I would recommend that you finish the prednisone, prednisone can cause some vague side effects as you know.  These can include difficulty sleeping, mood irritability etc. as discussed.  I would like for you to make a follow-up appointment with your primary care to discuss rechecking some of your labs in the near future.  Your vitamin D was mildly low in the recent past and your vitamin B12 level was on the low end of the spectrum.  These things take some time to increase when you take a supplement.  As far as your concentration difficulty and brain fog, you may need to get evaluated for underlying ADD.  Please talk to your primary care about evaluation for this. Your brain MRI was benign as well.  As your headaches are not severe or debilitating at this time, I do not recommend a day-to-day headache prevention.  You have also had some recent benefit from chiropractic treatments.

## 2021-04-18 DIAGNOSIS — M542 Cervicalgia: Secondary | ICD-10-CM | POA: Diagnosis not present

## 2021-04-18 DIAGNOSIS — M9902 Segmental and somatic dysfunction of thoracic region: Secondary | ICD-10-CM | POA: Diagnosis not present

## 2021-04-18 DIAGNOSIS — M9901 Segmental and somatic dysfunction of cervical region: Secondary | ICD-10-CM | POA: Diagnosis not present

## 2021-04-18 DIAGNOSIS — M546 Pain in thoracic spine: Secondary | ICD-10-CM | POA: Diagnosis not present

## 2021-04-24 ENCOUNTER — Other Ambulatory Visit (HOSPITAL_COMMUNITY): Payer: BC Managed Care – PPO

## 2021-04-25 ENCOUNTER — Ambulatory Visit: Payer: Self-pay | Admitting: Neurology

## 2021-05-17 DIAGNOSIS — M546 Pain in thoracic spine: Secondary | ICD-10-CM | POA: Diagnosis not present

## 2021-05-17 DIAGNOSIS — M542 Cervicalgia: Secondary | ICD-10-CM | POA: Diagnosis not present

## 2021-05-17 DIAGNOSIS — M9901 Segmental and somatic dysfunction of cervical region: Secondary | ICD-10-CM | POA: Diagnosis not present

## 2021-05-17 DIAGNOSIS — M9902 Segmental and somatic dysfunction of thoracic region: Secondary | ICD-10-CM | POA: Diagnosis not present

## 2021-05-20 DIAGNOSIS — J3089 Other allergic rhinitis: Secondary | ICD-10-CM | POA: Diagnosis not present

## 2021-05-20 DIAGNOSIS — L298 Other pruritus: Secondary | ICD-10-CM | POA: Diagnosis not present

## 2021-05-20 DIAGNOSIS — J3081 Allergic rhinitis due to animal (cat) (dog) hair and dander: Secondary | ICD-10-CM | POA: Diagnosis not present

## 2021-05-20 DIAGNOSIS — T781XXD Other adverse food reactions, not elsewhere classified, subsequent encounter: Secondary | ICD-10-CM | POA: Diagnosis not present

## 2021-05-31 DIAGNOSIS — M542 Cervicalgia: Secondary | ICD-10-CM | POA: Diagnosis not present

## 2021-05-31 DIAGNOSIS — M9902 Segmental and somatic dysfunction of thoracic region: Secondary | ICD-10-CM | POA: Diagnosis not present

## 2021-05-31 DIAGNOSIS — M9901 Segmental and somatic dysfunction of cervical region: Secondary | ICD-10-CM | POA: Diagnosis not present

## 2021-05-31 DIAGNOSIS — M546 Pain in thoracic spine: Secondary | ICD-10-CM | POA: Diagnosis not present

## 2021-06-03 ENCOUNTER — Ambulatory Visit (HOSPITAL_COMMUNITY): Payer: BC Managed Care – PPO | Attending: Internal Medicine

## 2021-06-03 ENCOUNTER — Other Ambulatory Visit: Payer: Self-pay

## 2021-06-03 DIAGNOSIS — R002 Palpitations: Secondary | ICD-10-CM | POA: Diagnosis not present

## 2021-06-03 DIAGNOSIS — R0609 Other forms of dyspnea: Secondary | ICD-10-CM

## 2021-06-03 DIAGNOSIS — J4530 Mild persistent asthma, uncomplicated: Secondary | ICD-10-CM | POA: Diagnosis not present

## 2021-06-03 DIAGNOSIS — R0600 Dyspnea, unspecified: Secondary | ICD-10-CM

## 2021-06-03 DIAGNOSIS — I422 Other hypertrophic cardiomyopathy: Secondary | ICD-10-CM | POA: Diagnosis not present

## 2021-06-03 LAB — ECHOCARDIOGRAM COMPLETE
Area-P 1/2: 6.9 cm2
S' Lateral: 2.5 cm

## 2021-06-03 MED ORDER — PERFLUTREN LIPID MICROSPHERE
1.0000 mL | INTRAVENOUS | Status: AC | PRN
  Administered 2021-06-03: 1 mL via INTRAVENOUS

## 2021-06-05 ENCOUNTER — Telehealth: Payer: Self-pay

## 2021-06-05 NOTE — Telephone Encounter (Signed)
Spoke with patient regarding results and recommendation.  Patient verbalizes understanding and is agreeable to plan of care. Advised patient to call back with any issues or concerns.  

## 2021-06-05 NOTE — Telephone Encounter (Signed)
-----   Message from Park Liter, MD sent at 06/05/2021  3:52 PM EDT ----- Echocardiogram showed normal left ventricular ejection fraction.  Overall looks good

## 2021-07-09 DIAGNOSIS — Z1159 Encounter for screening for other viral diseases: Secondary | ICD-10-CM | POA: Diagnosis not present

## 2021-07-09 DIAGNOSIS — L237 Allergic contact dermatitis due to plants, except food: Secondary | ICD-10-CM | POA: Diagnosis not present

## 2021-07-09 DIAGNOSIS — K824 Cholesterolosis of gallbladder: Secondary | ICD-10-CM | POA: Diagnosis not present

## 2021-07-09 DIAGNOSIS — I422 Other hypertrophic cardiomyopathy: Secondary | ICD-10-CM | POA: Diagnosis not present

## 2021-07-11 DIAGNOSIS — K824 Cholesterolosis of gallbladder: Secondary | ICD-10-CM | POA: Diagnosis not present

## 2021-08-02 DIAGNOSIS — J209 Acute bronchitis, unspecified: Secondary | ICD-10-CM | POA: Diagnosis not present

## 2021-08-02 DIAGNOSIS — R051 Acute cough: Secondary | ICD-10-CM | POA: Diagnosis not present

## 2021-08-29 DIAGNOSIS — J039 Acute tonsillitis, unspecified: Secondary | ICD-10-CM | POA: Diagnosis not present

## 2021-10-25 DIAGNOSIS — R070 Pain in throat: Secondary | ICD-10-CM | POA: Diagnosis not present

## 2021-10-25 DIAGNOSIS — J029 Acute pharyngitis, unspecified: Secondary | ICD-10-CM | POA: Diagnosis not present

## 2022-02-02 DIAGNOSIS — J029 Acute pharyngitis, unspecified: Secondary | ICD-10-CM | POA: Diagnosis not present

## 2022-04-10 DIAGNOSIS — K824 Cholesterolosis of gallbladder: Secondary | ICD-10-CM | POA: Diagnosis not present

## 2022-04-10 DIAGNOSIS — I422 Other hypertrophic cardiomyopathy: Secondary | ICD-10-CM | POA: Diagnosis not present

## 2022-06-24 ENCOUNTER — Encounter: Payer: Self-pay | Admitting: Cardiology

## 2022-06-24 NOTE — Progress Notes (Signed)
Sent message, via epic in basket, requesting orders in epic from surgeon.  

## 2022-06-25 ENCOUNTER — Telehealth: Payer: Self-pay

## 2022-06-25 DIAGNOSIS — R0609 Other forms of dyspnea: Secondary | ICD-10-CM

## 2022-06-25 NOTE — Telephone Encounter (Signed)
ECHO per Dr. Agustin Cree before surgery on the 14th. Order for ECHO submitted.

## 2022-06-26 DIAGNOSIS — R079 Chest pain, unspecified: Secondary | ICD-10-CM | POA: Diagnosis not present

## 2022-06-26 DIAGNOSIS — S299XXA Unspecified injury of thorax, initial encounter: Secondary | ICD-10-CM | POA: Diagnosis not present

## 2022-06-26 DIAGNOSIS — R0789 Other chest pain: Secondary | ICD-10-CM | POA: Diagnosis not present

## 2022-06-26 DIAGNOSIS — R9431 Abnormal electrocardiogram [ECG] [EKG]: Secondary | ICD-10-CM | POA: Diagnosis not present

## 2022-06-27 NOTE — Progress Notes (Signed)
COVID Vaccine Completed:  Date of COVID positive in last 90 days:  PCP - Roma Schanz, DO Cardiologist - Jenne Campus, MD  Chest x-ray - last week in [place], req EKG - las week in [place], req Stress Test - 07/26/18 Epic ECHO - 06/03/21 Epic Wednesday morning Cardiac Cath - n/a Pacemaker/ICD device last checked: n/a Spinal Cord Stimulator: n/a  Bowel Prep - no  Sleep Study - n/a CPAP -   Fasting Blood Sugar - n/a Checks Blood Sugar _____ times a day  Blood Thinner Instructions: n/a Aspirin Instructions: Last Dose:  Activity level: Can go up a flight of stairs and perform activities of daily living without stopping and without symptoms of chest pain or shortness of breath.  Anesthesia review: Stevens-Johnson Syndrome, hypertrophic cardiomyopathy, palpitations, DRESS syndrome   Patient denies shortness of breath, fever, cough and chest pain at PAT appointment  Patient verbalized understanding of instructions that were given to them at the PAT appointment. Patient was also instructed that they will need to review over the PAT instructions again at home before surgery.

## 2022-06-27 NOTE — Progress Notes (Signed)
Please place orders for PAT appointment scheduled 06/30/22.

## 2022-06-27 NOTE — Patient Instructions (Signed)
SURGICAL WAITING ROOM VISITATION Patients having surgery or a procedure may have no more than 2 support people in the waiting area - these visitors may rotate.   Children under the age of 25 must have an adult with them who is not the patient. If the patient needs to stay at the hospital during part of their recovery, the visitor guidelines for inpatient rooms apply. Pre-op nurse will coordinate an appropriate time for 1 support person to accompany patient in pre-op.  This support person may not rotate.    Please refer to the [facility] website for the visitor guidelines for Inpatients (after your surgery is over and you are in a regular room).    Your procedure is scheduled on: 07/04/22   Report to [facility] Main Entrance    Report to admitting at 5:15 AM   Call this number if you have problems the morning of surgery [phone]   Do not eat food :After Midnight.   After Midnight you may have the following liquids until 4:30 AM DAY OF SURGERY  Water Non-Citrus Juices (without pulp, NO RED) Carbonated Beverages Black Coffee (NO MILK/CREAM OR CREAMERS, sugar ok)  Clear Tea (NO MILK/CREAM OR CREAMERS, sugar ok) regular and decaf                             Plain Jell-O (NO RED)                                           Fruit ices (not with fruit pulp, NO RED)                                     Popsicles (NO RED)                                                               Sports drinks like Gatorade (NO RED)       FOLLOW BOWEL PREP AND ANY ADDITIONAL PRE OP INSTRUCTIONS YOU RECEIVED FROM YOUR SURGEON'S OFFICE!!!     Oral Hygiene is also important to reduce your risk of infection.                                    Remember - BRUSH YOUR TEETH THE MORNING OF SURGERY WITH YOUR REGULAR TOOTHPASTE   Take these medicines the morning of surgery with A SIP OF WATER: None                               You may not have any metal on your body including jewelry, and body  piercing             Do not wear lotions, powders, cologne, or deodorant              Men may shave face and neck.   Do not bring valuables to the hospital. [facility] IS NOT  RESPONSIBLE   FOR VALUABLES.  DO NOT [place]. PHARMACY WILL DISPENSE MEDICATIONS LISTED ON YOUR MEDICATION LIST TO YOU DURING YOUR ADMISSION [place]!    Patients discharged on the day of surgery will not be allowed to drive home.  Someone NEEDS to stay with you for the first 24 hours after anesthesia.              Please read over the following fact sheets you were given: IF YOU HAVE QUESTIONS ABOUT YOUR PRE-OP INSTRUCTIONS PLEASE CALL [place] - Preparing for Surgery Before surgery, you can play an important role.  Because skin is not sterile, your skin needs to be as free of germs as possible.  You can reduce the number of germs on your skin by washing with CHG (chlorahexidine gluconate) soap before surgery.  CHG is an antiseptic cleaner which kills germs and bonds with the skin to continue killing germs even after washing. Please DO NOT use if you have an allergy to CHG or antibacterial soaps.  If your skin becomes reddened/irritated stop using the CHG and inform your nurse when you arrive at Short Stay. Do not shave (including legs and underarms) for at least 48 hours prior to the first CHG shower.  You may shave your face/neck.  Please follow these instructions carefully:  1.  Shower with CHG Soap the night before surgery and the  morning of surgery.  2.  If you choose to wash your hair, wash your hair first as usual with your normal  shampoo.  3.  After you shampoo, rinse your hair and body thoroughly to remove the shampoo.                             4.  Use CHG as you would any other liquid soap.  You can apply chg directly to the skin and wash.  Gently with a scrungie or clean washcloth.  5.  Apply the CHG Soap to your body  ONLY FROM THE NECK DOWN.   Do   not use on face/ open                           Wound or open sores. Avoid contact with eyes, ears mouth and   genitals (private parts).                       Wash face,  Genitals (private parts) with your normal soap.             6.  Wash thoroughly, paying special attention to the area where your    surgery  will be performed.  7.  Thoroughly rinse your body with warm water from the neck down.  8.  DO NOT shower/wash with your normal soap after using and rinsing off the CHG Soap.                9.  Pat yourself dry with a clean towel.            10.  Wear clean pajamas.            11.  Place clean sheets on your bed the night of your first shower and do not  sleep with pets. Day of Surgery : Do not apply any lotions/deodorants the morning of surgery.  Please wear clean clothes to the hospital/surgery center.  FAILURE TO FOLLOW THESE INSTRUCTIONS MAY RESULT IN THE CANCELLATION OF YOUR SURGERY  PATIENT SIGNATURE_________________________________  NURSE SIGNATURE__________________________________  ________________________________________________________________________

## 2022-06-30 ENCOUNTER — Encounter (HOSPITAL_COMMUNITY)
Admission: RE | Admit: 2022-06-30 | Discharge: 2022-06-30 | Disposition: A | Discharge status: Home / Self Care | Payer: BC Managed Care – PPO | Source: Ambulatory Visit | Attending: General Surgery | Admitting: General Surgery

## 2022-06-30 ENCOUNTER — Encounter (HOSPITAL_COMMUNITY): Payer: Self-pay

## 2022-06-30 VITALS — BP 133/92 | HR 93 | Temp 98.6°F | Resp 12 | Ht 67.0 in | Wt 176.0 lb

## 2022-06-30 DIAGNOSIS — K824 Cholesterolosis of gallbladder: Secondary | ICD-10-CM | POA: Diagnosis not present

## 2022-06-30 DIAGNOSIS — K811 Chronic cholecystitis: Secondary | ICD-10-CM | POA: Diagnosis not present

## 2022-06-30 DIAGNOSIS — I422 Other hypertrophic cardiomyopathy: Secondary | ICD-10-CM | POA: Insufficient documentation

## 2022-06-30 DIAGNOSIS — Z01818 Encounter for other preprocedural examination: Secondary | ICD-10-CM | POA: Insufficient documentation

## 2022-06-30 HISTORY — DX: Pneumonia, unspecified organism: J18.9

## 2022-06-30 HISTORY — DX: Gastro-esophageal reflux disease without esophagitis: K21.9

## 2022-06-30 LAB — CBC
HCT: 48.3 % (ref 39.0–52.0)
Hemoglobin: 16.6 g/dL (ref 13.0–17.0)
MCH: 30.1 pg (ref 26.0–34.0)
MCHC: 34.4 g/dL (ref 30.0–36.0)
MCV: 87.7 fL (ref 80.0–100.0)
Platelets: 186 10*3/uL (ref 150–400)
RBC: 5.51 MIL/uL (ref 4.22–5.81)
RDW: 13.3 % (ref 11.5–15.5)
WBC: 5.5 10*3/uL (ref 4.0–10.5)
nRBC: 0 % (ref 0.0–0.2)

## 2022-06-30 LAB — BASIC METABOLIC PANEL
Anion gap: 7 (ref 5–15)
BUN: 11 mg/dL (ref 6–20)
CO2: 27 mmol/L (ref 22–32)
Calcium: 10 mg/dL (ref 8.9–10.3)
Chloride: 110 mmol/L (ref 98–111)
Creatinine, Ser: 0.89 mg/dL (ref 0.61–1.24)
GFR, Estimated: >60 mL/min (ref >60)
Glucose, Bld: 108 mg/dL — ABNORMAL HIGH (ref 70–99)
Potassium: 4.4 mmol/L (ref 3.5–5.1)
Sodium: 144 mmol/L (ref 135–145)

## 2022-07-02 ENCOUNTER — Encounter (HOSPITAL_COMMUNITY): Payer: Self-pay | Admitting: General Surgery

## 2022-07-02 ENCOUNTER — Ambulatory Visit (HOSPITAL_BASED_OUTPATIENT_CLINIC_OR_DEPARTMENT_OTHER)
Admission: RE | Admit: 2022-07-02 | Discharge: 2022-07-02 | Disposition: A | Discharge status: Home / Self Care | Payer: BC Managed Care – PPO | Source: Ambulatory Visit | Attending: Cardiology | Admitting: Cardiology

## 2022-07-02 DIAGNOSIS — R0609 Other forms of dyspnea: Secondary | ICD-10-CM | POA: Diagnosis not present

## 2022-07-02 DIAGNOSIS — I422 Other hypertrophic cardiomyopathy: Secondary | ICD-10-CM

## 2022-07-02 LAB — ECHOCARDIOGRAM COMPLETE
Area-P 1/2: 3.85 cm2
S' Lateral: 2.5 cm

## 2022-07-02 MED ORDER — PERFLUTREN LIPID MICROSPHERE
1.0000 mL | INTRAVENOUS | Status: AC | PRN
  Administered 2022-07-02: 2 mL via INTRAVENOUS

## 2022-07-02 NOTE — Anesthesia Preprocedure Evaluation (Addendum)
Anesthesia Evaluation  Patient identified by MRN, date of birth, ID band Patient awake    Reviewed: Allergy & Precautions, NPO status , Patient's Chart, lab work & pertinent test results  History of Anesthesia Complications Negative for: history of anesthetic complications  Airway Mallampati: II  TM Distance: >3 FB Neck ROM: Full    Dental  (+) Dental Advisory Given, Teeth Intact   Pulmonary neg pulmonary ROS,    breath sounds clear to auscultation       Cardiovascular negative cardio ROS   Rhythm:Regular Rate:Normal  Non-obstructive hypertrophic cardiomyopathy 07/02/2022 ECHO: Moderate ASH, no SAM, no significant gradient across LVOT without  provocation, consistent with hyperthrophic cardiomyopathy without  obstruction. No significant changes compare to prior echocardiograms. Left  ventricular ejection fraction, by  estimation, is 60 to 65%. The left ventricle has normal function. The left  ventricle has no regional wall motion abnormalities. There is moderate  left ventricular hypertrophy of the septal segment.   Neuro/Psych negative neurological ROS     GI/Hepatic Neg liver ROS, GERD  Medicated and Controlled,  Endo/Other  negative endocrine ROS  Renal/GU negative Renal ROS     Musculoskeletal   Abdominal   Peds  Hematology negative hematology ROS (+)   Anesthesia Other Findings   Reproductive/Obstetrics                           Anesthesia Physical Anesthesia Plan  ASA: 2  Anesthesia Plan: General   Post-op Pain Management: Tylenol PO (pre-op)*   Induction: Intravenous  PONV Risk Score and Plan: 2 and Ondansetron and Dexamethasone  Airway Management Planned: Oral ETT  Additional Equipment: None  Intra-op Plan:   Post-operative Plan: Extubation in OR  Informed Consent: I have reviewed the patients History and Physical, chart, labs and discussed the procedure including  the risks, benefits and alternatives for the proposed anesthesia with the patient or authorized representative who has indicated his/her understanding and acceptance.     Dental advisory given  Plan Discussed with: CRNA and Surgeon  Anesthesia Plan Comments:        Anesthesia Quick Evaluation

## 2022-07-03 ENCOUNTER — Other Ambulatory Visit: Payer: Self-pay

## 2022-07-03 ENCOUNTER — Ambulatory Visit (HOSPITAL_COMMUNITY): Payer: BC Managed Care – PPO | Admitting: Anesthesiology

## 2022-07-03 ENCOUNTER — Ambulatory Visit (HOSPITAL_COMMUNITY)
Admission: RE | Admit: 2022-07-03 | Discharge: 2022-07-03 | Disposition: A | Discharge status: Home / Self Care | Payer: BC Managed Care – PPO | Attending: General Surgery | Admitting: General Surgery

## 2022-07-03 ENCOUNTER — Encounter (HOSPITAL_COMMUNITY)
Admission: RE | Disposition: A | Discharge status: Home / Self Care | Payer: Self-pay | Source: Home / Self Care | Attending: General Surgery

## 2022-07-03 ENCOUNTER — Encounter (HOSPITAL_COMMUNITY): Payer: Self-pay | Admitting: General Surgery

## 2022-07-03 DIAGNOSIS — K811 Chronic cholecystitis: Secondary | ICD-10-CM | POA: Diagnosis not present

## 2022-07-03 DIAGNOSIS — I422 Other hypertrophic cardiomyopathy: Secondary | ICD-10-CM | POA: Diagnosis not present

## 2022-07-03 DIAGNOSIS — K824 Cholesterolosis of gallbladder: Secondary | ICD-10-CM | POA: Diagnosis not present

## 2022-07-03 HISTORY — PX: CHOLECYSTECTOMY: SHX55

## 2022-07-03 SURGERY — LAPAROSCOPIC CHOLECYSTECTOMY
Anesthesia: General

## 2022-07-03 MED ORDER — ACETAMINOPHEN 500 MG PO TABS: 1000.0000 mg | ORAL_TABLET | Freq: Once | ORAL | Status: DC

## 2022-07-03 MED ORDER — ONDANSETRON HCL 4 MG/2ML IJ SOLN
INTRAMUSCULAR | Status: DC | PRN
  Administered 2022-07-03: 4 mg via INTRAVENOUS

## 2022-07-03 MED ORDER — ORAL CARE MOUTH RINSE: 15.0000 mL | Freq: Once | OROMUCOSAL | Status: AC

## 2022-07-03 MED ORDER — PROPOFOL 10 MG/ML IV BOLUS
INTRAVENOUS | Status: DC | PRN
  Administered 2022-07-03: 200 mg via INTRAVENOUS

## 2022-07-03 MED ORDER — ACETAMINOPHEN 500 MG PO TABS: 1000.0000 mg | ORAL_TABLET | Freq: Four times a day (QID) | ORAL | 0 refills | Status: AC

## 2022-07-03 MED ORDER — LACTATED RINGERS IV SOLN: INTRAVENOUS | Status: DC

## 2022-07-03 MED ORDER — MEPERIDINE HCL 50 MG/ML IJ SOLN: 6.2500 mg | INTRAMUSCULAR | Status: DC | PRN

## 2022-07-03 MED ORDER — SUGAMMADEX SODIUM 200 MG/2ML IV SOLN
INTRAVENOUS | Status: DC | PRN
  Administered 2022-07-03: 200 mg via INTRAVENOUS

## 2022-07-03 MED ORDER — KETAMINE HCL 10 MG/ML IJ SOLN
INTRAMUSCULAR | Status: DC | PRN
  Administered 2022-07-03: 35 mg via INTRAVENOUS

## 2022-07-03 MED ORDER — HYDROMORPHONE HCL 1 MG/ML IJ SOLN
INTRAMUSCULAR | Status: AC
  Administered 2022-07-03: 0.5 mg via INTRAVENOUS
  Filled 2022-07-03: qty 1

## 2022-07-03 MED ORDER — ROCURONIUM BROMIDE 100 MG/10ML IV SOLN
INTRAVENOUS | Status: DC | PRN
  Administered 2022-07-03: 60 mg via INTRAVENOUS

## 2022-07-03 MED ORDER — FENTANYL CITRATE (PF) 100 MCG/2ML IJ SOLN
INTRAMUSCULAR | Status: AC
  Filled 2022-07-03: qty 2

## 2022-07-03 MED ORDER — ACETAMINOPHEN 500 MG PO TABS
1000.0000 mg | ORAL_TABLET | Freq: Once | ORAL | Status: AC
  Administered 2022-07-03: 1000 mg via ORAL
  Filled 2022-07-03: qty 2

## 2022-07-03 MED ORDER — OXYCODONE HCL 5 MG PO TABS: 5.0000 mg | ORAL_TABLET | Freq: Once | ORAL | Status: AC | PRN

## 2022-07-03 MED ORDER — BUPIVACAINE-EPINEPHRINE 0.25% -1:200000 IJ SOLN
INTRAMUSCULAR | Status: DC | PRN
  Administered 2022-07-03: 27 mL

## 2022-07-03 MED ORDER — SODIUM CHLORIDE 0.9 % IV SOLN
2.0000 g | Freq: Once | INTRAVENOUS | Status: AC
  Administered 2022-07-03: 2 g via INTRAVENOUS
  Filled 2022-07-03: qty 2

## 2022-07-03 MED ORDER — ROCURONIUM BROMIDE 10 MG/ML (PF) SYRINGE
PREFILLED_SYRINGE | INTRAVENOUS | Status: AC
  Filled 2022-07-03: qty 10

## 2022-07-03 MED ORDER — DEXAMETHASONE SODIUM PHOSPHATE 10 MG/ML IJ SOLN
INTRAMUSCULAR | Status: DC | PRN
  Administered 2022-07-03: 5 mg via INTRAVENOUS

## 2022-07-03 MED ORDER — ONDANSETRON HCL 4 MG/2ML IJ SOLN
INTRAMUSCULAR | Status: AC
  Filled 2022-07-03: qty 2

## 2022-07-03 MED ORDER — DEXAMETHASONE SODIUM PHOSPHATE 10 MG/ML IJ SOLN
INTRAMUSCULAR | Status: AC
  Filled 2022-07-03: qty 1

## 2022-07-03 MED ORDER — PROPOFOL 10 MG/ML IV BOLUS
INTRAVENOUS | Status: AC
  Filled 2022-07-03: qty 20

## 2022-07-03 MED ORDER — MIDAZOLAM HCL 2 MG/2ML IJ SOLN
INTRAMUSCULAR | Status: AC
  Filled 2022-07-03: qty 2

## 2022-07-03 MED ORDER — PROMETHAZINE HCL 25 MG/ML IJ SOLN: 6.2500 mg | INTRAMUSCULAR | Status: DC | PRN

## 2022-07-03 MED ORDER — LIDOCAINE HCL (PF) 2 % IJ SOLN
INTRAMUSCULAR | Status: AC
  Filled 2022-07-03: qty 5

## 2022-07-03 MED ORDER — HYDROMORPHONE HCL 1 MG/ML IJ SOLN
0.2500 mg | INTRAMUSCULAR | Status: DC | PRN
  Administered 2022-07-03: 0.5 mg via INTRAVENOUS

## 2022-07-03 MED ORDER — FENTANYL CITRATE (PF) 100 MCG/2ML IJ SOLN
INTRAMUSCULAR | Status: DC | PRN
  Administered 2022-07-03: 100 ug via INTRAVENOUS
  Administered 2022-07-03 (×2): 50 ug via INTRAVENOUS
  Administered 2022-07-03: 100 ug via INTRAVENOUS

## 2022-07-03 MED ORDER — PHENYLEPHRINE HCL-NACL 20-0.9 MG/250ML-% IV SOLN
INTRAVENOUS | Status: AC
  Filled 2022-07-03: qty 250

## 2022-07-03 MED ORDER — MIDAZOLAM HCL 5 MG/5ML IJ SOLN
INTRAMUSCULAR | Status: DC | PRN
  Administered 2022-07-03 (×2): 1 mg via INTRAVENOUS

## 2022-07-03 MED ORDER — CHLORHEXIDINE GLUCONATE 0.12 % MT SOLN
15.0000 mL | Freq: Once | OROMUCOSAL | Status: AC
  Administered 2022-07-03: 15 mL via OROMUCOSAL

## 2022-07-03 MED ORDER — LIDOCAINE HCL (CARDIAC) PF 100 MG/5ML IV SOSY
PREFILLED_SYRINGE | INTRAVENOUS | Status: DC | PRN
  Administered 2022-07-03: 10 mg via INTRAVENOUS
  Administered 2022-07-03: 40 mg via INTRAVENOUS

## 2022-07-03 MED ORDER — KETAMINE HCL 10 MG/ML IJ SOLN
INTRAMUSCULAR | Status: AC
  Filled 2022-07-03: qty 1

## 2022-07-03 MED ORDER — ESMOLOL HCL 100 MG/10ML IV SOLN
INTRAVENOUS | Status: DC | PRN
  Administered 2022-07-03: 20 mg via INTRAVENOUS
  Administered 2022-07-03: 15 mg via INTRAVENOUS

## 2022-07-03 MED ORDER — OXYCODONE HCL 5 MG PO TABS
ORAL_TABLET | ORAL | Status: AC
  Administered 2022-07-03: 5 mg via ORAL
  Filled 2022-07-03: qty 1

## 2022-07-03 MED ORDER — OXYCODONE HCL 5 MG PO TABS: 5.0000 mg | ORAL_TABLET | Freq: Four times a day (QID) | ORAL | 0 refills | Status: AC | PRN

## 2022-07-03 MED ORDER — OXYCODONE HCL 5 MG/5ML PO SOLN: 5.0000 mg | Freq: Once | ORAL | Status: AC | PRN

## 2022-07-03 MED ORDER — 0.9 % SODIUM CHLORIDE (POUR BTL) OPTIME
TOPICAL | Status: DC | PRN
  Administered 2022-07-03: 1000 mL

## 2022-07-03 MED ORDER — INDOCYANINE GREEN 25 MG IV SOLR
INTRAVENOUS | Status: DC | PRN
  Administered 2022-07-03: 2.5 mg via INTRAVENOUS

## 2022-07-03 MED ORDER — FENTANYL CITRATE (PF) 250 MCG/5ML IJ SOLN
INTRAMUSCULAR | Status: AC
  Filled 2022-07-03: qty 5

## 2022-07-03 MED ORDER — LACTATED RINGERS IR SOLN
Status: DC | PRN
  Administered 2022-07-03: 1000 mL

## 2022-07-03 MED ORDER — MIDAZOLAM HCL 2 MG/2ML IJ SOLN: 0.5000 mg | Freq: Once | INTRAMUSCULAR | Status: DC | PRN

## 2022-07-03 SURGICAL SUPPLY — 56 items
ADH SKN CLS APL DERMABOND .7 (GAUZE/BANDAGES/DRESSINGS) ×2
APL PRP STRL LF DISP 70% ISPRP (MISCELLANEOUS) ×1
APL SRG 38 LTWT LNG FL B (MISCELLANEOUS)
APPLICATOR ARISTA FLEXITIP XL (MISCELLANEOUS) IMPLANT
APPLIER CLIP 5 13 M/L LIGAMAX5 (MISCELLANEOUS)
APPLIER CLIP ROT 10 11.4 M/L (STAPLE)
APR CLP MED LRG 11.4X10 (STAPLE)
APR CLP MED LRG 5 ANG JAW (MISCELLANEOUS)
BAG COUNTER SPONGE SURGICOUNT (BAG) IMPLANT
BAG SPEC RTRVL 10 TROC 200 (ENDOMECHANICALS) ×1
BAG SPNG CNTER NS LX DISP (BAG)
CABLE HIGH FREQUENCY MONO STRZ (ELECTRODE) ×1 IMPLANT
CHLORAPREP W/TINT 26 (MISCELLANEOUS) ×1 IMPLANT
CLIP APPLIE 5 13 M/L LIGAMAX5 (MISCELLANEOUS) IMPLANT
CLIP APPLIE ROT 10 11.4 M/L (STAPLE) IMPLANT
CLIP LIGATING HEMO O LOK GREEN (MISCELLANEOUS) IMPLANT
COVER MAYO STAND XLG (MISCELLANEOUS) IMPLANT
COVER SURGICAL LIGHT HANDLE (MISCELLANEOUS) ×1 IMPLANT
DERMABOND ADVANCED .7 DNX12 (GAUZE/BANDAGES/DRESSINGS) IMPLANT
DRAPE C-ARM 42X120 X-RAY (DRAPES) IMPLANT
DRSG TEGADERM 2-3/8X2-3/4 SM (GAUZE/BANDAGES/DRESSINGS) ×3 IMPLANT
DRSG TEGADERM 4X4.75 (GAUZE/BANDAGES/DRESSINGS) ×1 IMPLANT
ELECT REM PT RETURN 15FT ADLT (MISCELLANEOUS) ×1 IMPLANT
GAUZE SPONGE 2X2 8PLY STRL LF (GAUZE/BANDAGES/DRESSINGS) ×1 IMPLANT
GLOVE BIO SURGEON STRL SZ7.5 (GLOVE) ×1 IMPLANT
GLOVE INDICATOR 8.0 STRL GRN (GLOVE) ×1 IMPLANT
GOWN STRL REUS W/ TWL XL LVL3 (GOWN DISPOSABLE) ×1 IMPLANT
GOWN STRL REUS W/TWL XL LVL3 (GOWN DISPOSABLE) ×1
GRASPER SUT TROCAR 14GX15 (MISCELLANEOUS) IMPLANT
HEMOSTAT ARISTA ABSORB 3G PWDR (HEMOSTASIS) IMPLANT
HEMOSTAT SNOW SURGICEL 2X4 (HEMOSTASIS) IMPLANT
IRRIG SUCT STRYKERFLOW 2 WTIP (MISCELLANEOUS) ×1
IRRIGATION SUCT STRKRFLW 2 WTP (MISCELLANEOUS) ×1 IMPLANT
KIT BASIN OR (CUSTOM PROCEDURE TRAY) ×1 IMPLANT
KIT IMAGING PINPOINTPAQ (MISCELLANEOUS) IMPLANT
KIT TURNOVER KIT A (KITS) IMPLANT
L-HOOK LAP DISP 36CM (ELECTROSURGICAL)
LHOOK LAP DISP 36CM (ELECTROSURGICAL) IMPLANT
POUCH RETRIEVAL ECOSAC 10 (ENDOMECHANICALS) ×1 IMPLANT
POUCH RETRIEVAL ECOSAC 10MM (ENDOMECHANICALS) ×1
SCISSORS LAP 5X35 DISP (ENDOMECHANICALS) ×1 IMPLANT
SET CHOLANGIOGRAPH MIX (MISCELLANEOUS) IMPLANT
SET TUBE SMOKE EVAC HIGH FLOW (TUBING) ×1 IMPLANT
SLEEVE Z-THREAD 5X100MM (TROCAR) ×2 IMPLANT
SPIKE FLUID TRANSFER (MISCELLANEOUS) ×1 IMPLANT
STRIP CLOSURE SKIN 1/2X4 (GAUZE/BANDAGES/DRESSINGS) ×1 IMPLANT
SUT MNCRL AB 4-0 PS2 18 (SUTURE) ×1 IMPLANT
SUT VIC AB 0 UR5 27 (SUTURE) IMPLANT
SUT VICRYL 0 TIES 12 18 (SUTURE) IMPLANT
SUT VICRYL 0 UR6 27IN ABS (SUTURE) IMPLANT
TOWEL OR 17X26 10 PK STRL BLUE (TOWEL DISPOSABLE) ×1 IMPLANT
TOWEL OR NON WOVEN STRL DISP B (DISPOSABLE) ×1 IMPLANT
TRAY LAPAROSCOPIC (CUSTOM PROCEDURE TRAY) ×1 IMPLANT
TROCAR 11X100 Z THREAD (TROCAR) IMPLANT
TROCAR BALLN 12MMX100 BLUNT (TROCAR) ×1 IMPLANT
TROCAR Z-THREAD OPTICAL 5X100M (TROCAR) ×1 IMPLANT

## 2022-07-03 NOTE — Op Note (Addendum)
Dennis Kim [id number] Aug 07, 1996 07/03/2022  Laparoscopic Cholecystectomy with near infrared immunofluorescent cholangiography procedure Note  Indications: This patient presents with gallbladder polyps that are increasing in size and will undergo laparoscopic cholecystectomy.  Pre-operative Diagnosis: Gallbladder polyps  Post-operative Diagnosis: Same  Surgeon: Greer Pickerel MD FACS  Assistants: Toula Moos, PGY-4  Anesthesia: General endotracheal anesthesia  Procedure Details  The patient was seen again in the Holding Room. The risks, benefits, complications, treatment options, and expected outcomes were discussed with the patient. The possibilities of reaction to medication, pulmonary aspiration, perforation of viscus, bleeding, recurrent infection, finding a normal gallbladder, the need for additional procedures, failure to diagnose a condition, the possible need to convert to an open procedure, and creating a complication requiring transfusion or operation were discussed with the patient. The likelihood of improving the patient's symptoms with return to their baseline status is good.  The patient and/or family concurred with the proposed plan, giving informed consent. The site of surgery properly noted. The patient was taken to Operating Room, identified as Dennis Kim and the procedure verified as Laparoscopic Cholecystectomy with ICG dye.  A Time Out was held and the above information confirmed. Antibiotic prophylaxis was administered.   Prior to the induction of general anesthesia, antibiotic prophylaxis was administered. General endotracheal anesthesia was then administered and tolerated well. After the induction, the abdomen was prepped with Chloraprep and draped in the sterile fashion. The patient was positioned in the supine position.  Local anesthetic agent was injected into the skin near the umbilicus and an incision made. We dissected down to the abdominal fascia with blunt  dissection.  The fascia was incised vertically and we entered the peritoneal cavity bluntly.  A pursestring suture of 0-Vicryl was placed around the fascial opening.  The Hasson cannula was inserted and secured with the stay suture.  Pneumoperitoneum was then created with CO2 and tolerated well without any adverse changes in the patient's vital signs. An 5-mm port was placed in the subxiphoid position.  Two 5-mm ports were placed in the right upper quadrant. All skin incisions were infiltrated with a local anesthetic agent before making the incision and placing the trocars.   We positioned the patient in reverse Trendelenburg, tilted slightly to the patient's left.  The gallbladder was identified, the fundus grasped and retracted cephalad. Omental Adhesions were lysed bluntly and with the electrocautery where indicated, taking care not to injure any adjacent organs or viscus. The infundibulum was grasped and retracted laterally, exposing the peritoneum overlying the triangle of Calot. This was then divided and exposed in a blunt fashion. A critical view of the cystic duct and cystic artery was obtained.  The cystic duct was clearly identified and bluntly dissected circumferentially.  Utilizing the [employer]ry[employer] camera system near infrared immunofluorescent activity was visualized in the liver, cystic duct, common hepatic duct and common bile duct.  This served as a secondary confirmation of our anatomy.  The cystic duct was then ligated with hemolock clips and divided. The cystic artery which had been identified & dissected free was ligated with hemolock clips and divided as well.   The gallbladder was dissected from the liver bed in retrograde fashion with the electrocautery. The gallbladder was removed and placed in an Ecco sac.  The gallbladder and Ecco sac were then removed through the umbilical port site. The liver bed was irrigated and inspected. Hemostasis was achieved with the electrocautery. Copious  irrigation was utilized and was repeatedly aspirated until clear.  The pursestring  suture was used to close the umbilical fascia.  There was an air leak at the umbilical fascial closure.  2 additional 0 interrupted Vicryl sutures were placed using a PMI suture passer with laparoscopic guidance.  There was no air leak and the fascial closure felt good and tight.  Local was infiltrated.  We again inspected the right upper quadrant for hemostasis.  The umbilical closure was inspected and there was no air leak and nothing trapped within the closure. Pneumoperitoneum was released as we removed the trocars.  4-0 Monocryl was used to close the skin.   Dermabond was applied. The patient was then extubated and brought to the recovery room in stable condition. Instrument, sponge, and needle counts were correct at closure and at the conclusion of the case.   Findings: Near infrared immunofluorescent cholangiography was performed.  Immunofluorescent activity was seen within the cystic duct, liver, common hepatic duct and common bile duct.  Estimated Blood Loss: Minimal         Drains: none         Specimens: Gallbladder           Complications: None; patient tolerated the procedure well.         Disposition: PACU - hemodynamically stable.         Condition: stable  I was personally present & scrubbed during the entire procedure except for superficial skin closure but immediately available, as documented in my operative note.   Leighton Ruff. Redmond Pulling, MD, FACS General, Bariatric, & Minimally Invasive Surgery [facility],  [place]

## 2022-07-03 NOTE — Progress Notes (Signed)
Dr Wilson paged for surgery orders 

## 2022-07-03 NOTE — Anesthesia Postprocedure Evaluation (Signed)
Anesthesia Post Note  Patient: Dennis Kim  Procedure(s) Performed: LAPAROSCOPIC CHOLECYSTECTOMY WITH ICG DYE INDOCYANINE GREEN FLUORESCENCE IMAGING (ICG)     Patient location during evaluation: PACU Anesthesia Type: General Level of consciousness: awake and alert, patient cooperative and oriented Pain management: pain level controlled Vital Signs Assessment: post-procedure vital signs reviewed and stable Respiratory status: spontaneous breathing, nonlabored ventilation and respiratory function stable Cardiovascular status: blood pressure returned to baseline and stable Postop Assessment: no apparent nausea or vomiting Anesthetic complications: no   No notable events documented.  Last Vitals:  Vitals:   07/03/22 1030 07/03/22 1108  BP: (!) 154/95 (!) 143/84  Pulse: 97 96  Resp: 15 16  Temp: 36.7 C 36.6 C  SpO2: 100% 96%    Last Pain:  Vitals:   07/03/22 1108  TempSrc: Oral  PainSc: 3                  Laquanna Veazey,E. Alick Lecomte

## 2022-07-03 NOTE — H&P (Signed)
CC: here for gallbladder surgery  Requesting provider: here for surgery  HPI: Dennis Kim is an 26 y.o. male who is here for lap chole with ICG for gallbladder polyps.  Denies any changes since I saw him last.  States that he had his annual echocardiogram yesterday and got a good report.  Denies any shortness of breath or dyspnea on exertion.  Old hpi: Dennis Kim is a 26 y.o. male who is seen today for bladder polyps. I last saw him about a year and 3 months ago in March 2022. He was having some GERD symptoms and had biopsy-proven GERD but he was also found to have incidental gallbladder polyps on ultrasound imaging. Gallbladder is had increased in size on prior imaging and so I had recommended cholecystectomy but he elected close surveillance. He denies any abdominal pain. He denies any nausea or vomiting. He denies any weight loss. He recently got married and his wife is a Press photographer. She is going to med school in [place]. His family is still in this area. He states his GERD is very in frequent now. No family history of gallbladder, liver or pancreas issues. No prior abdominal surgery. He had a follow-up right upper quadrant ultrasound at [facility] back in September which showed that his polyps had increased in size from his prior imaging.  No chest pain, chest pressure, shortness of breath, dyspnea on exertion, paroxysmal nocturnal dyspnea    Past Medical History:  Diagnosis Date   Asthma    Chest pain 07/18/2018   Chest pain 07/18/2018   DRESS syndrome 03/25/2021   Dyspnea on exertion 07/16/2018   Family history of brain cancer    Family history of breast cancer    Family history of colon cancer    Family history of melanoma    Family history of uterine cancer    GERD (gastroesophageal reflux disease)    Hypertrophic cardiomyopathy ([place]) 07/16/2018   Nonobstructive, anterior septal 17 mm thickness   Influenza A 11/07/2018   Mild persistent asthma 07/30/2018   Palpitations  07/16/2018   Pneumonia    as child   Preventative health care 05/20/2016   Seasonal and perennial allergic rhinitis 07/30/2018   Stevens-Johnson syndrome ([address]) 11/12/2018   Urinary frequency 10/11/2018    Past Surgical History:  Procedure Laterality Date   NO PAST SURGERIES     UPPER GI ENDOSCOPY     WISDOM TOOTH EXTRACTION  2021    Family History  Problem Relation Age of Onset   Brain cancer Maternal Grandmother 87       glioblastoma\   Thyroid disease Father    Breast cancer Mother 46       POT+   Melanoma Mother    Migraines Mother    Breast cancer Paternal Grandmother    Kidney disease Paternal Uncle    Hypertrophic cardiomyopathy Paternal Uncle 73   Breast cancer Maternal Grandfather 44       ATM+   Colon cancer Paternal Grandfather 73   Uterine cancer Other 35       PGF's sister    Social:  reports that he has never smoked. He has never used smokeless tobacco. He reports current alcohol use. He reports current drug use. Drug: Marijuana.  Allergies:  Allergies  Allergen Reactions   Bactrim [Sulfamethoxazole-Trimethoprim]     [name] syndrome   Sulfa Antibiotics Hives    [name] syndrome    Medications: I have reviewed the patient's current medications.  ROS - all of the below systems have been reviewed with the patient and positives are indicated with bold text General: chills, fever or night sweats Eyes: blurry vision or double vision ENT: epistaxis or sore throat Allergy/Immunology: itchy/watery eyes or nasal congestion Hematologic/Lymphatic: bleeding problems, blood clots or swollen lymph nodes Endocrine: temperature intolerance or unexpected weight changes Breast: new or changing breast lumps or nipple discharge Resp: cough, shortness of breath, or wheezing CV: chest pain or dyspnea on exertion GI: as per HPI GU: dysuria, trouble voiding, or hematuria MSK: joint pain or joint stiffness Neuro: TIA or stroke symptoms Derm: pruritus  and skin lesion changes Psych: anxiety and depression  PE Blood pressure (!) 140/84, pulse 89, temperature 98.1 F (36.7 C), temperature source Oral, resp. rate 15, height _0  (1.702 m), weight 79.8 kg, SpO2 100 %. Constitutional: NAD; conversant; no deformities Eyes: Moist conjunctiva; no lid lag; anicteric; PERRL Neck: Trachea midline; no thyromegaly Lungs: Normal respiratory effort; no tactile fremitus CV: RRR; no palpable thrills; no pitting edema GI: Abd soft, nt, nd; no palpable hepatosplenomegaly MSK: Normal gait; no clubbing/cyanosis Psychiatric: Appropriate affect; alert and oriented x3 Lymphatic: No palpable cervical or axillary lymphadenopathy Skin:no rash/lesions.   No results found for this or any previous visit (from the past 48 hour(s)).  ECHOCARDIOGRAM COMPLETE  Result Date: 07/02/2022    ECHOCARDIOGRAM REPORT   Patient Name:   Dennis Kim Date of Exam: 07/02/2022 Medical Rec #:  [id number]      Height:       67.0 in Accession #:    [id number]     Weight:       176.0 lb Date of Birth:  21-Jun-1996      BSA:          1.915 m Patient Age:    26 years       BP:           136/82 mmHg Patient Gender: M              HR:           76 bpm. Exam Location:  [place] Procedure: 2D Echo, Cardiac Doppler, Color Doppler and Intracardiac            Opacification Agent Indications:    Hypertrophic cardiomyopaty  History:        Patient has prior history of Echocardiogram examinations, most                 recent 06/03/2021.  Sonographer:    Merrie Roof RDCS Referring Phys: [place]  1. Moderate ASH, no SAM, no significant gradient across LVOT without provocation, consistent with hyperthrophic cardiomyopathy without obstruction. No significant changes compare to prior echocardiograms. Left ventricular ejection fraction, by estimation, is 60 to 65%. The left ventricle has normal function. The left ventricle has no regional wall motion abnormalities. There is  moderate left ventricular hypertrophy of the septal segment. Left ventricular diastolic parameters were normal.  2. Right ventricular systolic function is normal. The right ventricular size is normal.  3. The mitral valve is normal in structure. No evidence of mitral valve regurgitation. No evidence of mitral stenosis.  4. The aortic valve is normal in structure. Aortic valve regurgitation is not visualized. No aortic stenosis is present.  5. The inferior vena cava is normal in size with greater than 50% respiratory variability, suggesting right atrial pressure of 3 mmHg. FINDINGS  Left Ventricle: Moderate ASH, no SAM, no significant  gradient across LVOT without provocation, consistent with hyperthrophic cardiomyopathy without obstruction. No significant changes compare to prior echocardiograms. Left ventricular ejection fraction,  by estimation, is 60 to 65%. The left ventricle has normal function. The left ventricle has no regional wall motion abnormalities. The left ventricular internal cavity size was normal in size. There is moderate left ventricular hypertrophy of the septal  segment. Left ventricular diastolic parameters were normal. Right Ventricle: The right ventricular size is normal. No increase in right ventricular wall thickness. Right ventricular systolic function is normal. Left Atrium: Left atrial size was normal in size. Right Atrium: Right atrial size was normal in size. Pericardium: There is no evidence of pericardial effusion. Mitral Valve: The mitral valve is normal in structure. No evidence of mitral valve regurgitation. No evidence of mitral valve stenosis. Tricuspid Valve: The tricuspid valve is normal in structure. Tricuspid valve regurgitation is trivial. No evidence of tricuspid stenosis. Aortic Valve: The aortic valve is normal in structure. Aortic valve regurgitation is not visualized. No aortic stenosis is present. Pulmonic Valve: The pulmonic valve was normal in structure. Pulmonic  valve regurgitation is not visualized. No evidence of pulmonic stenosis. Aorta: The aortic root is normal in size and structure. Venous: The inferior vena cava is normal in size with greater than 50% respiratory variability, suggesting right atrial pressure of 3 mmHg. IAS/Shunts: No atrial level shunt detected by color flow Doppler.  LEFT VENTRICLE PLAX 2D LVIDd:         4.70 cm   Diastology LVIDs:         2.50 cm   LV e' medial:    8.59 cm/s LV PW:         0.90 cm   LV E/e' medial:  9.1 LV IVS:        1.80 cm   LV e' lateral:   13.10 cm/s LVOT diam:     2.00 cm   LV E/e' lateral: 6.0 LV SV:         63 LV SV Index:   33 LVOT Area:     3.14 cm  RIGHT VENTRICLE RV S prime:     14.40 cm/s TAPSE (M-mode): 2.6 cm LEFT ATRIUM           Index        RIGHT ATRIUM           Index LA diam:      3.60 cm 1.88 cm/m   RA Area:     14.10 cm LA Vol (A4C): 44.8 ml 23.39 ml/m  RA Volume:   39.20 ml  20.47 ml/m  AORTIC VALVE LVOT Vmax:   107.00 cm/s LVOT Vmean:  63.200 cm/s LVOT VTI:    0.202 m  AORTA Ao Root diam: 3.10 cm Ao Asc diam:  2.80 cm Ao Arch diam: 2.1 cm MITRAL VALVE MV Area (PHT): 3.85 cm    SHUNTS MV Decel Time: 197 msec    Systemic VTI:  0.20 m MV E velocity: 78.40 cm/s  Systemic Diam: 2.00 cm MV A velocity: 42.40 cm/s MV E/A ratio:  1.85 Jenne Campus MD Electronically signed by Jenne Campus MD Signature Date/Time: 07/02/2022/5:02:42 PM    Final     Imaging: reviewed  A/P: Dennis Kim is an 26 y.o. male with  Gallbladder polyps  2 OR for laparoscopic cholecystectomy with ICG dye Enhanced recovery protocol IV antibiotic Discussed surgical resident involvement with this case Questions asked and answered  Leighton Ruff. Redmond Pulling, MD, FACS General, Bariatric, &  Minimally Invasive Surgery [place]

## 2022-07-03 NOTE — Discharge Instructions (Signed)
[facility] LAPAROSCOPIC SURGERY: POST OP INSTRUCTIONS Always review your discharge instruction sheet given to you by the facility where your surgery was performed. IF YOU HAVE DISABILITY OR FAMILY LEAVE FORMS, YOU MUST BRING THEM TO THE OFFICE FOR PROCESSING.   DO NOT GIVE THEM TO YOUR DOCTOR.  PAIN CONTROL  First take acetaminophen (Tylenol) AND/or ibuprofen (Advil) to control your pain after surgery.  Follow directions on package.  Taking acetaminophen (Tylenol) and/or ibuprofen (Advil) regularly after surgery will help to control your pain and lower the amount of prescription pain medication you may need.  You should not take more than 3,000 mg (3 grams) of acetaminophen (Tylenol) in 24 hours.  You should not take ibuprofen (Advil), aleve, motrin, naprosyn or other NSAIDS if you have a history of stomach ulcers or chronic kidney disease.  A prescription for pain medication may be given to you upon discharge.  Take your pain medication as prescribed, if you still have uncontrolled pain after taking acetaminophen (Tylenol) or ibuprofen (Advil). Use ice packs to help control pain. If you need a refill on your pain medication, please contact your pharmacy.  They will contact our office to request authorization. Prescriptions will not be filled after 5pm or on week-ends.  HOME MEDICATIONS Take your usually prescribed medications unless otherwise directed.  DIET You should follow a light diet the first few days after arrival home.  Be sure to include lots of fluids daily. Avoid fatty, fried foods.   CONSTIPATION It is common to experience some constipation after surgery and if you are taking pain medication.  Increasing fluid intake and taking a stool softener (such as Colace) will usually help or prevent this problem from occurring.  A mild laxative (Milk of Magnesia or Miralax) should be taken according to package instructions if there are no bowel movements after 48  hours.  WOUND/INCISION CARE Most patients will experience some swelling and bruising in the area of the incisions.  Ice packs will help.  Swelling and bruising can take several days to resolve.  Unless discharge instructions indicate otherwise, follow guidelines below  STERI-STRIPS - you may remove your outer bandages 48 hours after surgery, and you may shower at that time.  You have steri-strips (small skin tapes) in place directly over the incision.  These strips should be left on the skin for 7-10 days.   DERMABOND/SKIN GLUE - you may shower in 24 hours.  The glue will flake off over the next 2-3 weeks. Any sutures or staples will be removed at the office during your follow-up visit.  ACTIVITIES You may resume regular (light) daily activities beginning the next day--such as daily self-care, walking, climbing stairs--gradually increasing activities as tolerated.  You may have sexual intercourse when it is comfortable.  Refrain from any heavy lifting or straining until approved by your doctor. You may drive when you are no longer taking prescription pain medication, you can comfortably wear a seatbelt, and you can safely maneuver your car and apply brakes.  FOLLOW-UP You should see your doctor in the office for a follow-up appointment approximately 2-3 weeks after your surgery.  You should have been given your post-op/follow-up appointment when your surgery was scheduled.  If you did not receive a post-op/follow-up appointment, make sure that you call for this appointment within a day or two after you arrive home to insure a convenient appointment time.  OTHER INSTRUCTIONS   WHEN TO CALL YOUR DOCTOR: Fever over 101.0 Inability to urinate Continued   bleeding from incision. Increased pain, redness, or drainage from the incision. Increasing abdominal pain  The clinic staff is available to answer your questions during regular business hours.  Please don't hesitate to call and ask to speak to  one of the nurses for clinical concerns.  If you have a medical emergency, go to the nearest emergency room or call 911.  A surgeon from [facility] is always on call at the hospital. [address], [address], [place], [place]  [address] ? [address] [address], [place], [place]   [address] [phone] ? [phone] ? [phone] [phone] Web site: [email or web address]  

## 2022-07-03 NOTE — Anesthesia Procedure Notes (Signed)
Procedure Name: Intubation Date/Time: 07/03/2022 7:46 AM  Performed by: Garrel Ridgel, CRNAPre-anesthesia Checklist: Patient identified, Emergency Drugs available, Suction available and Patient being monitored Patient Re-evaluated:Patient Re-evaluated prior to induction Oxygen Delivery Method: Circle system utilized Preoxygenation: Pre-oxygenation with 100% oxygen Induction Type: IV induction Ventilation: Mask ventilation without difficulty Laryngoscope Size: Mac and 4 Grade View: Grade I Tube type: Oral Tube size: 7.5 mm Number of attempts: 1 Airway Equipment and Method: Stylet and Oral airway Placement Confirmation: ETT inserted through vocal cords under direct vision, positive ETCO2 and breath sounds checked- equal and bilateral Secured at: 22 cm Tube secured with: Tape Dental Injury: Teeth and Oropharynx as per pre-operative assessment

## 2022-07-03 NOTE — Transfer of Care (Signed)
Immediate Anesthesia Transfer of Care Note  Patient: Dennis Kim  Procedure(s) Performed: LAPAROSCOPIC CHOLECYSTECTOMY WITH ICG DYE INDOCYANINE GREEN FLUORESCENCE IMAGING (ICG)  Patient Location: PACU  Anesthesia Type:General  Level of Consciousness: oriented, drowsy and patient cooperative  Airway & Oxygen Therapy: Patient Spontanous Breathing and Patient connected to face mask oxygen  Post-op Assessment: Report given to RN and Post -op Vital signs reviewed and stable  Post vital signs: Reviewed and stable  Last Vitals:  Vitals Value Taken Time  BP 159/101 07/03/22 0919  Temp    Pulse 82 07/03/22 0921  Resp 15 07/03/22 0921  SpO2 96 % 07/03/22 0921  Vitals shown include unvalidated device data.  Last Pain:  Vitals:   07/03/22 0625  TempSrc:   PainSc: 0-No pain         Complications: No notable events documented.

## 2022-07-04 ENCOUNTER — Encounter (HOSPITAL_COMMUNITY): Payer: Self-pay | Admitting: General Surgery

## 2022-07-04 LAB — SURGICAL PATHOLOGY

## 2022-07-08 ENCOUNTER — Telehealth: Payer: Self-pay

## 2022-07-08 NOTE — Telephone Encounter (Signed)
Results reviewed with pt as per Dr. Krasowski's note.  Pt verbalized understanding and had no additional questions. Routed to PCP  

## 2022-08-12 IMAGING — US US ABDOMEN LIMITED
2 series · 13 of 25 positions shown · non-contrast
Comparison: October 07, 2018 ultrasound

CLINICAL DATA: Known gallbladder polyps.

EXAM:
ULTRASOUND ABDOMEN LIMITED RIGHT UPPER QUADRANT

[Series 1: us abdomen limited · 12 of 36 slices shown (1 of 2)]
[im 1/36]
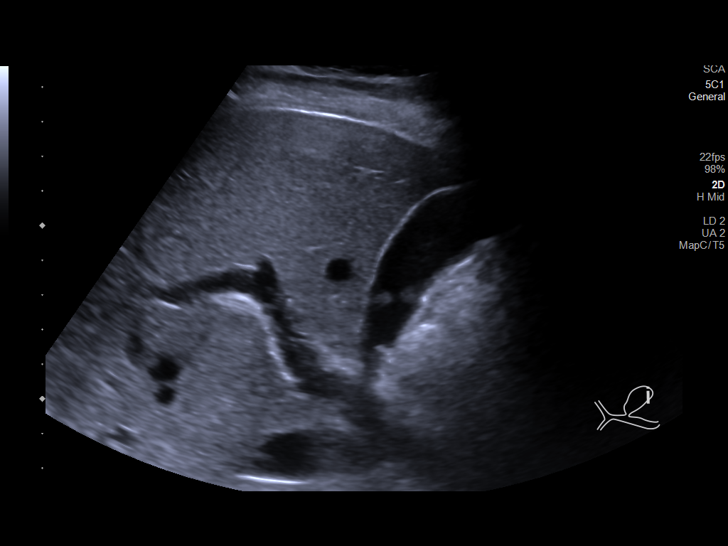
[im 4/36]
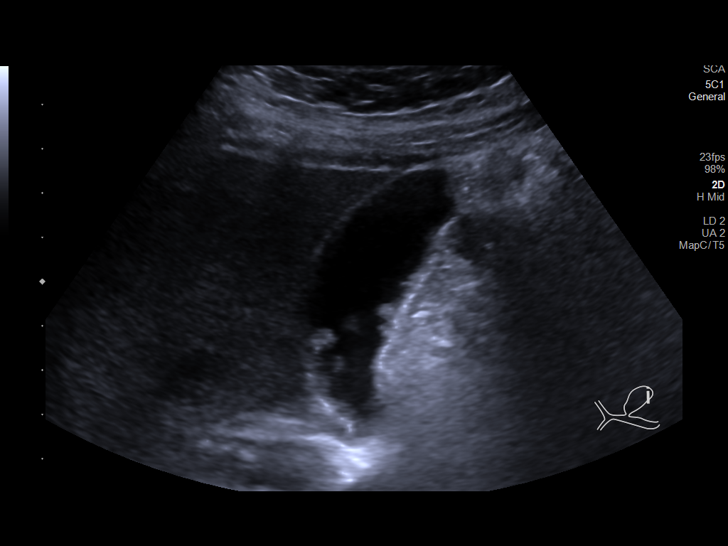
[im 7/36]
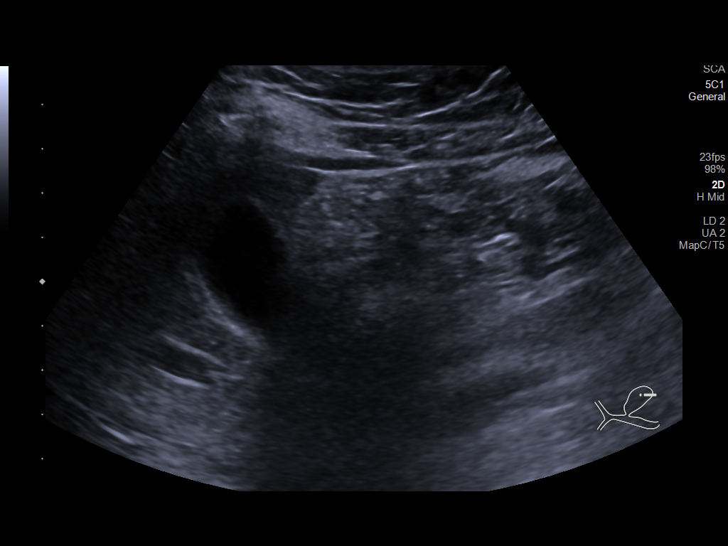
[im 10/36]
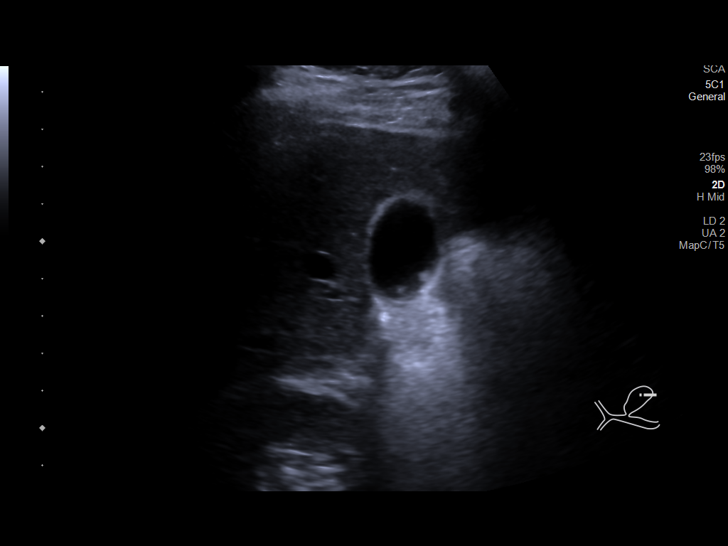
[im 13/36]
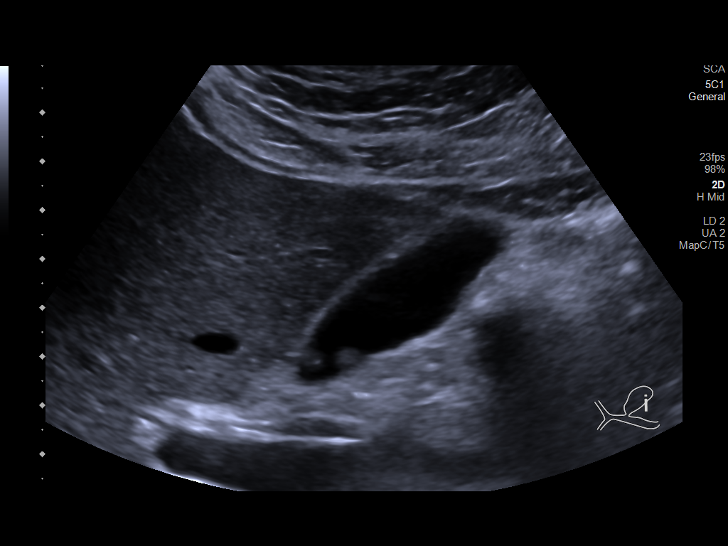
[im 16/36]
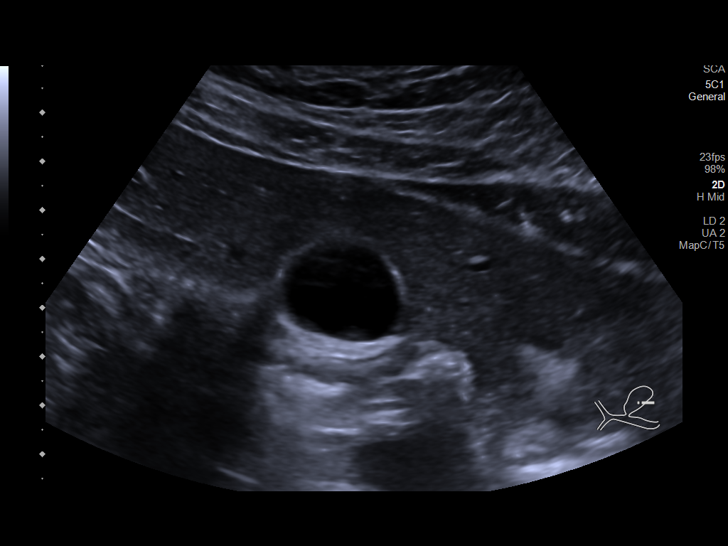
[im 19/36]
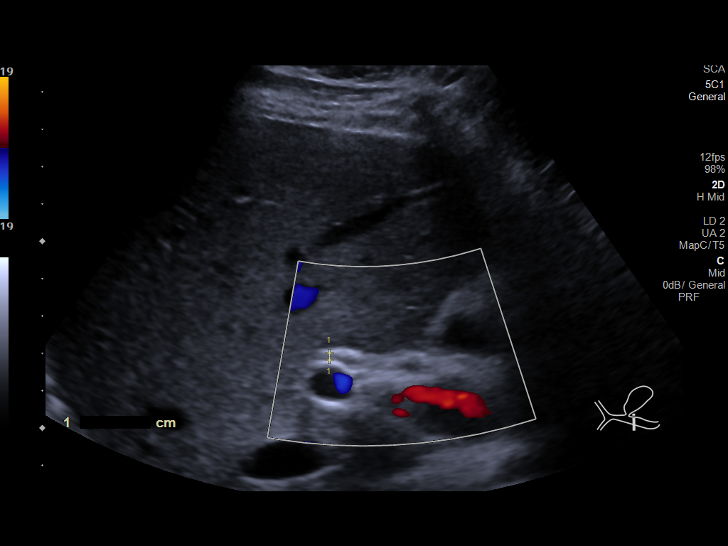
[im 22/36]
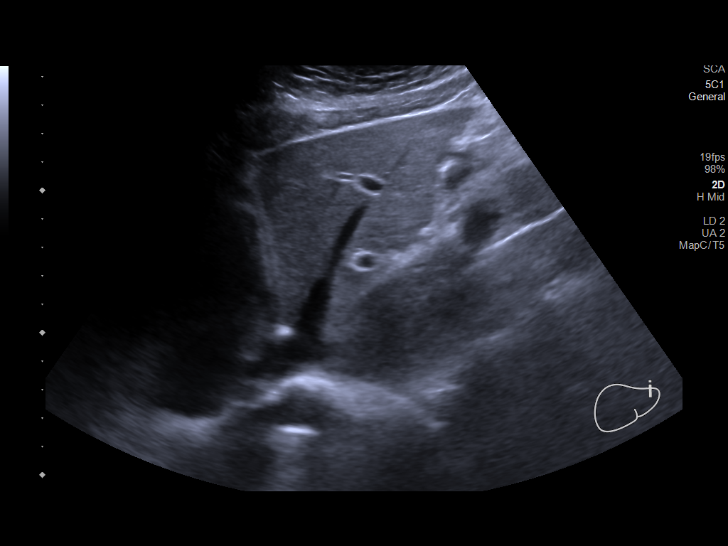
[im 25/36]
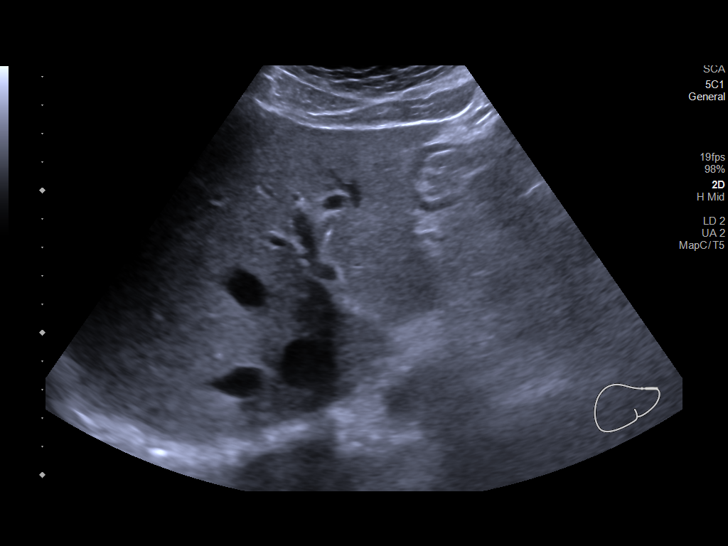
[im 28/36]
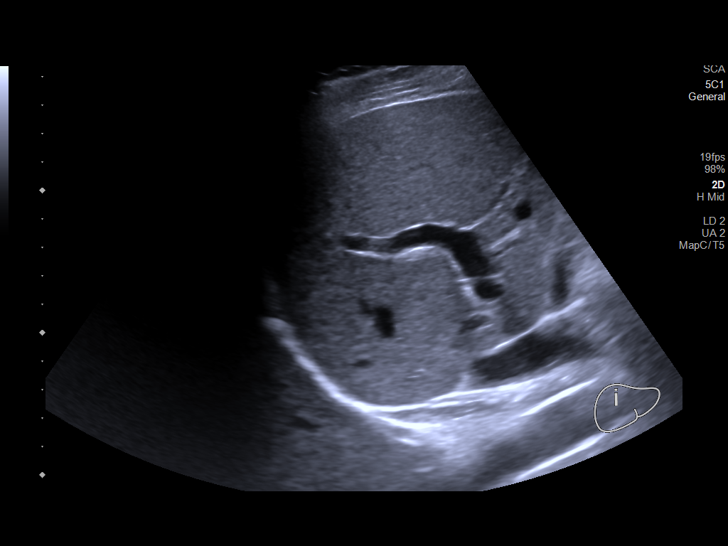
[im 31/36]
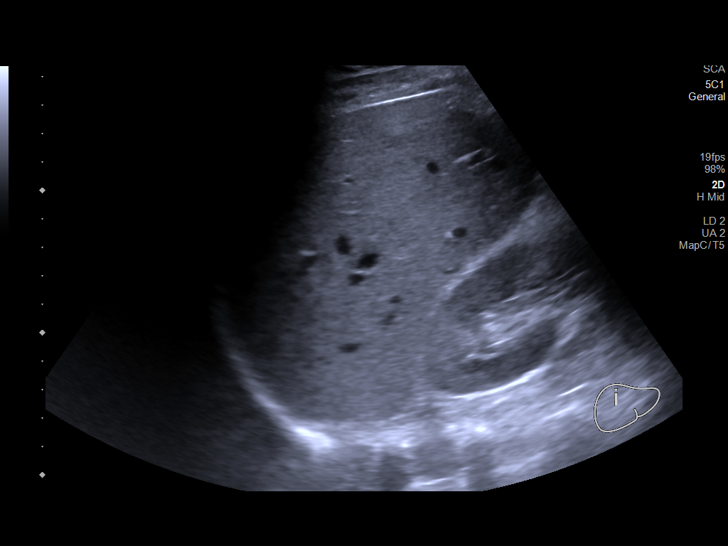
[im 34/36]
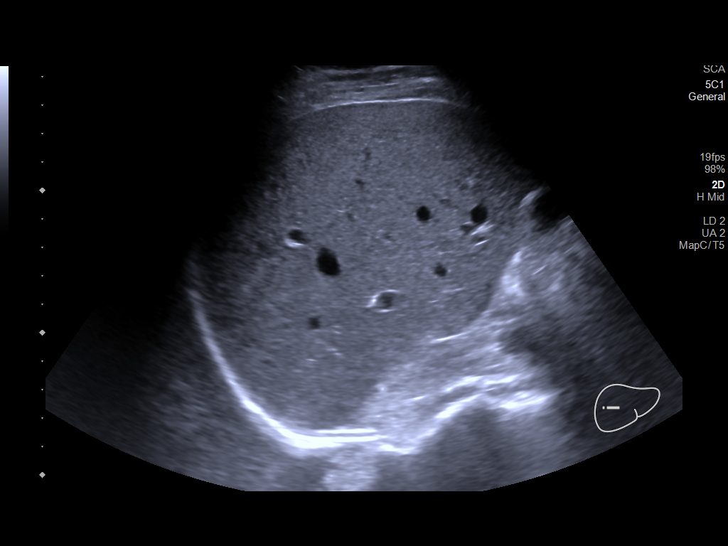

[Series 1001: us abdomen limited · 1 of 1 slices shown (2 of 2)]
[im 1/1]
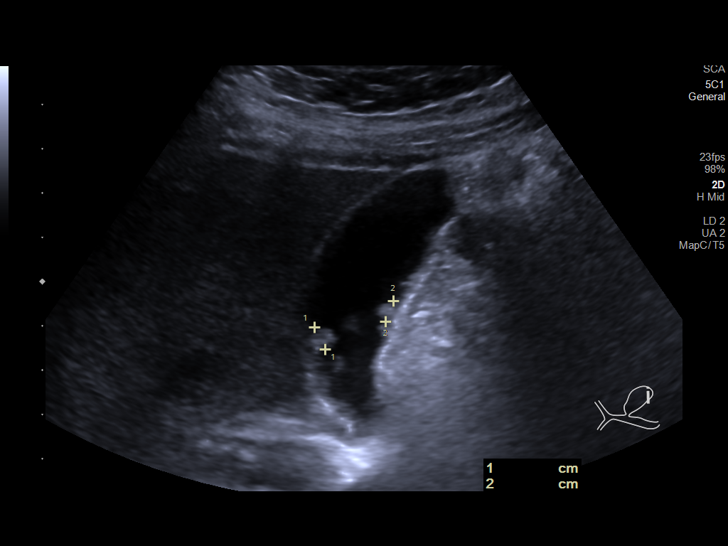

[13 of 25 positions shown; findings below may reference images not displayed]

FINDINGS: Gallbladder:

Numerous gallbladder polyps are identified. The largest measures
mm today. This polyp measured 3.5 mm previously. There is also a 5
mm polyp today that measure 4.5 mm previously. No stones, sludge,
wall thickening, or pericholecystic fluid. No Murphy's sign.

Common bile duct:

Diameter: 1.9 mm

Liver:

No focal lesion identified. Within normal limits in parenchymal
echogenicity. Portal vein is patent on color Doppler imaging with
normal direction of blood flow towards the liver.

Other: None.
IMPRESSION: 1. Interval growth of at least 1 of the patient's many gallbladder
polyps. The largest polyp measures 5.5 mm today. This polyp measured
3.5 mm previously. Typically polyps less than or equal to 6 mm in
size do not require follow-up. However, given the interval growth
and the patient's young age, surgical consultation is recommended
for possible removal. If surgical consultation or removal or not
pursued, recommend a follow-up ultrasound in 6-12 months for
follow-up.

These results will be called to the ordering clinician or
representative by the Radiologist Assistant, and communication
documented in the PACS or [REDACTED].

## 2022-11-05 IMAGING — CR DG CHEST 2V
1 series · 2 of 2 positions shown · non-contrast
Comparison: 11/08/2020

CLINICAL DATA: Centralized nonradiating chest pain, history of
reflux

EXAM:
CHEST - 2 VIEW

[Series 1: dg chest 2 view · 0.14mm/px · 2 of 2 slices shown]
[im 1/2]
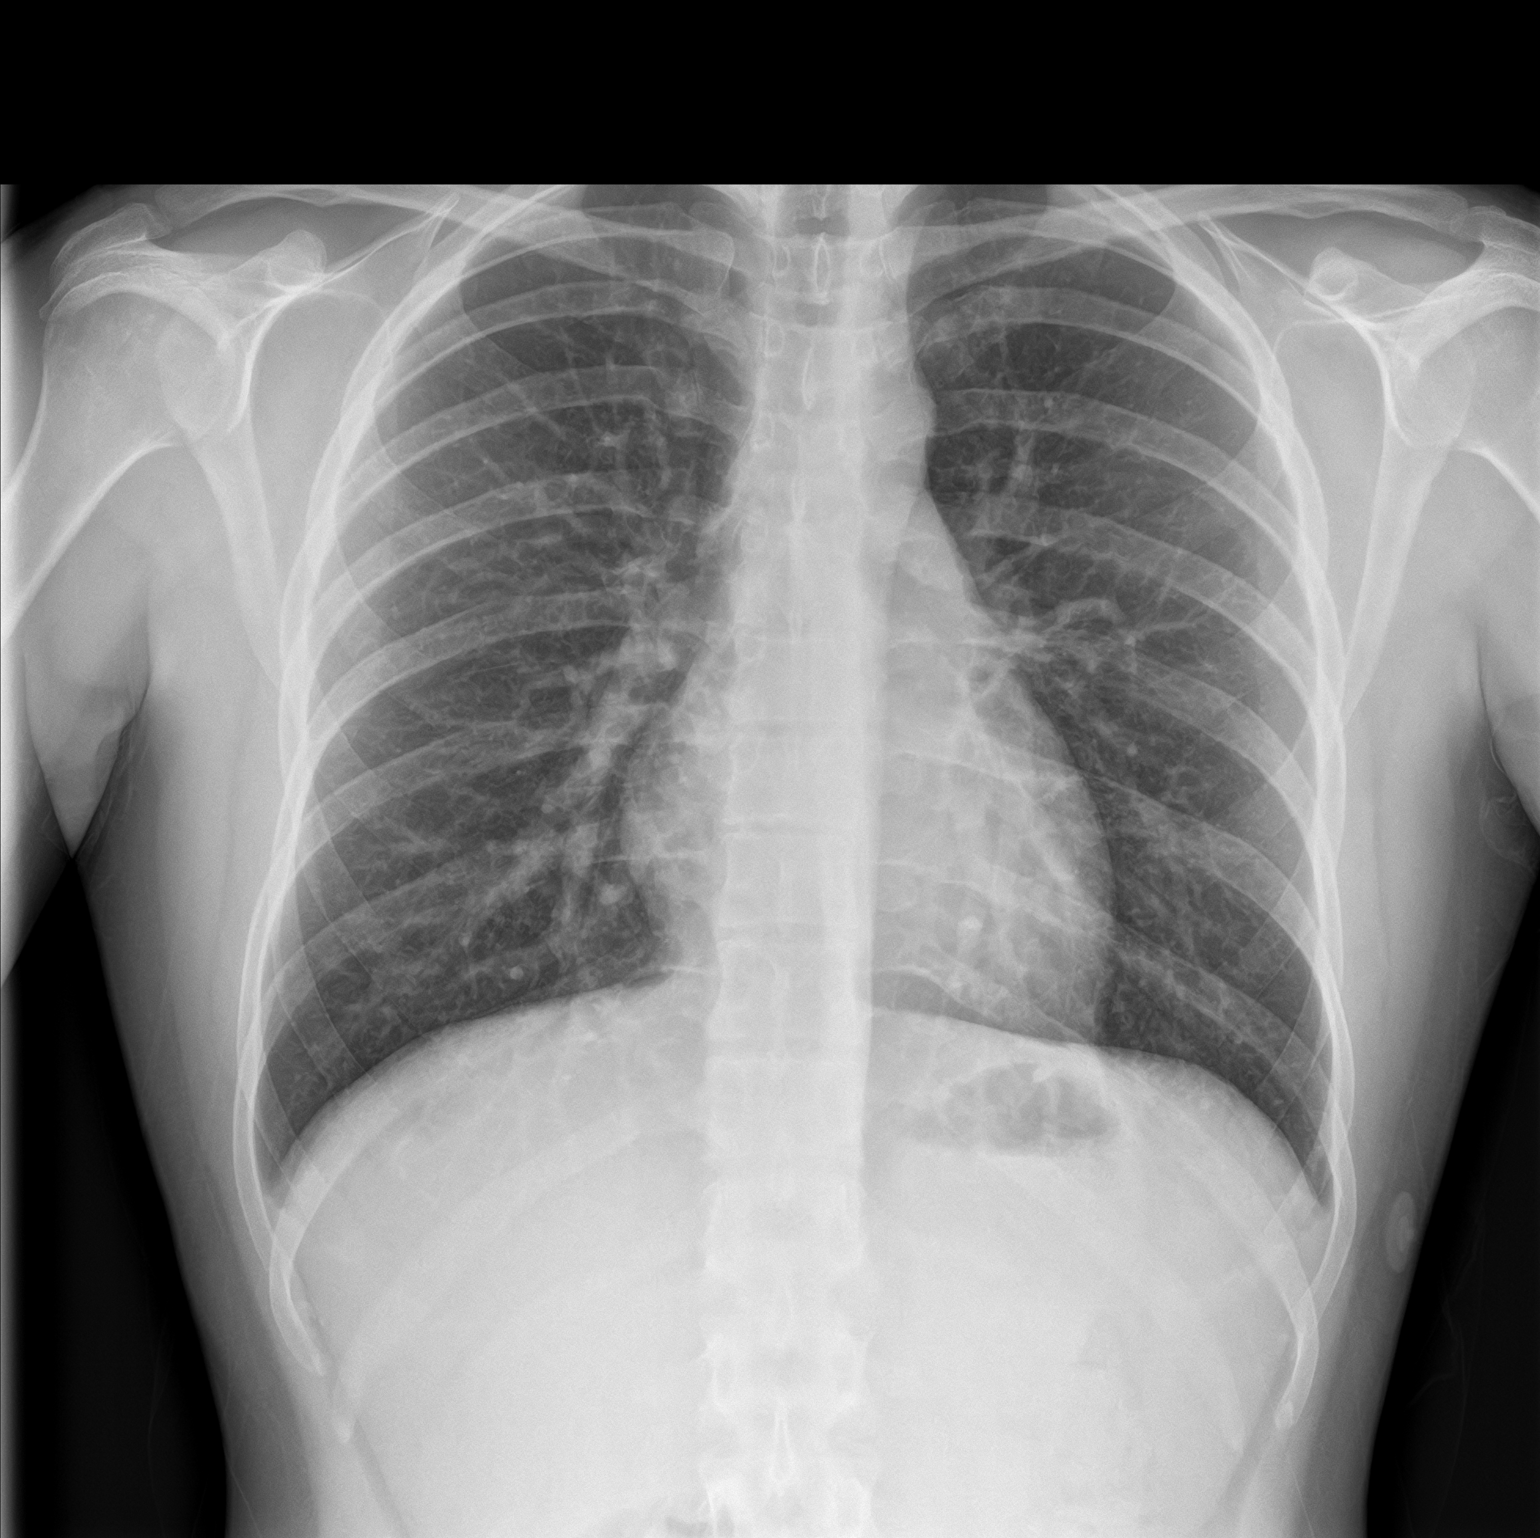
[im 2/2]
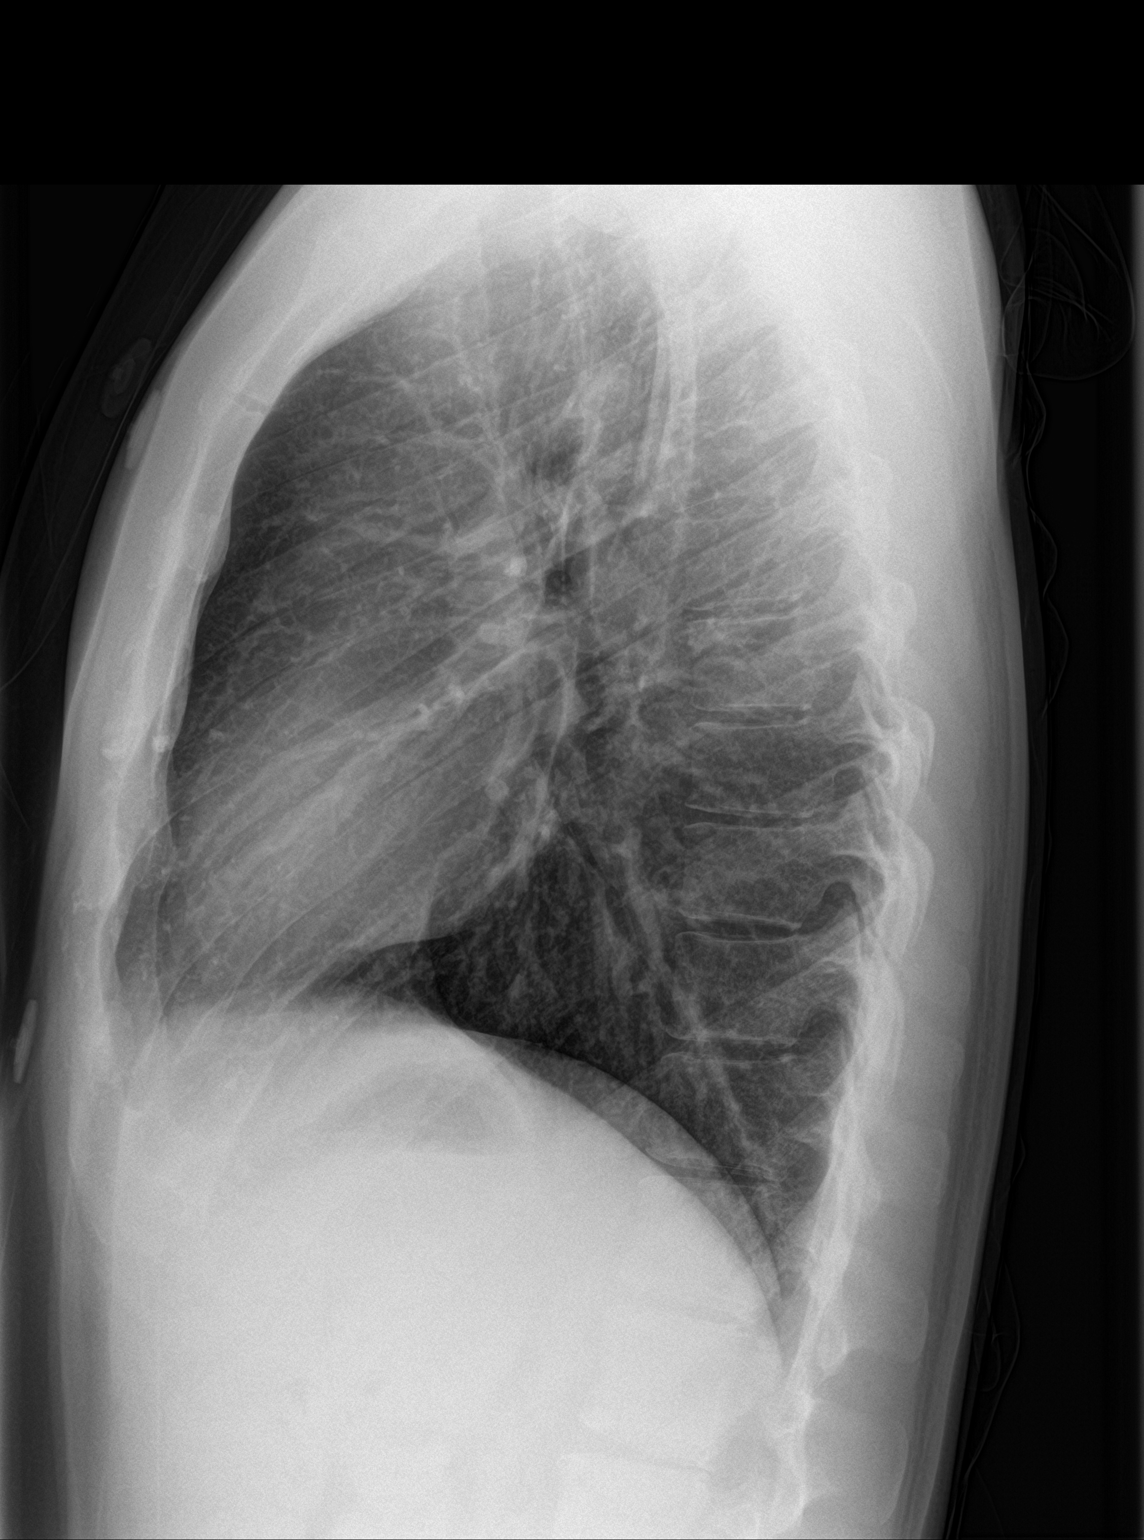

[2 of 2 positions shown; findings below may reference images not displayed]

FINDINGS: The heart size and mediastinal contours are within normal limits.
Both lungs are clear. The visualized skeletal structures are
unremarkable.
IMPRESSION: No active cardiopulmonary disease.
